# Patient Record
Sex: Male | Born: 1970
Health system: Southern US, Community
[De-identification: ages and names within clinical notes are randomized; demographics above are authoritative.]

## PROBLEM LIST (undated history)

## (undated) DIAGNOSIS — K219 Gastro-esophageal reflux disease without esophagitis: Secondary | ICD-10-CM

## (undated) DIAGNOSIS — F419 Anxiety disorder, unspecified: Secondary | ICD-10-CM

## (undated) DIAGNOSIS — G4730 Sleep apnea, unspecified: Secondary | ICD-10-CM

## (undated) HISTORY — PX: BACK SURGERY: SHX140

## (undated) HISTORY — PX: NASAL SEPTUM SURGERY: SHX37

---

## 2013-09-12 ENCOUNTER — Ambulatory Visit: Payer: BC Managed Care – PPO | Admitting: Family Medicine

## 2013-09-12 ENCOUNTER — Ambulatory Visit: Payer: BC Managed Care – PPO

## 2013-09-12 VITALS — BP 122/74 | HR 67 | Temp 97.9°F | Resp 16 | Ht 71.0 in | Wt 242.0 lb

## 2013-09-12 DIAGNOSIS — R059 Cough, unspecified: Secondary | ICD-10-CM

## 2013-09-12 DIAGNOSIS — J45909 Unspecified asthma, uncomplicated: Secondary | ICD-10-CM

## 2013-09-12 DIAGNOSIS — R05 Cough: Secondary | ICD-10-CM

## 2013-09-12 LAB — POCT CBC
Granulocyte percent: 61.3 %G (ref 37–80)
HEMATOCRIT: 52 % (ref 43.5–53.7)
HEMOGLOBIN: 17.3 g/dL (ref 14.1–18.1)
Lymph, poc: 2 (ref 0.6–3.4)
MCH, POC: 32.5 pg — AB (ref 27–31.2)
MCHC: 33.3 g/dL (ref 31.8–35.4)
MCV: 97.8 fL — AB (ref 80–97)
MID (cbc): 0.5 (ref 0–0.9)
MPV: 8.9 fL (ref 0–99.8)
POC GRANULOCYTE: 4 (ref 2–6.9)
POC LYMPH PERCENT: 30.6 %L (ref 10–50)
POC MID %: 8.1 %M (ref 0–12)
Platelet Count, POC: 268 10*3/uL (ref 142–424)
RBC: 5.32 M/uL (ref 4.69–6.13)
RDW, POC: 13 %
WBC: 6.6 10*3/uL (ref 4.6–10.2)

## 2013-09-12 MED ORDER — ALBUTEROL SULFATE (2.5 MG/3ML) 0.083% IN NEBU
2.5000 mg | INHALATION_SOLUTION | Freq: Once | RESPIRATORY_TRACT | Status: AC
  Administered 2013-09-12: 2.5 mg via RESPIRATORY_TRACT

## 2013-09-12 MED ORDER — ALBUTEROL SULFATE HFA 108 (90 BASE) MCG/ACT IN AERS
2.0000 | INHALATION_SPRAY | RESPIRATORY_TRACT | Status: DC | PRN
Start: 2013-09-12 — End: 2018-05-01

## 2013-09-12 MED ORDER — BECLOMETHASONE DIPROPIONATE 80 MCG/ACT IN AERS: 2.0000 | INHALATION_SPRAY | Freq: Two times a day (BID) | RESPIRATORY_TRACT | Status: DC

## 2013-09-12 MED ORDER — AZITHROMYCIN 250 MG PO TABS: ORAL_TABLET | ORAL | Status: DC

## 2013-09-12 NOTE — Patient Instructions (Addendum)
Qvar 2 inhalations twice daily  Albuterol inhaler 2 inhalations 4 times daily  zithromax 2 initially then one daily for infection  Drink plenty of water. The better hydrated UR the thenar any secretions will be.  If not improving dramatically over the next week get back to me

## 2013-09-12 NOTE — Progress Notes (Signed)
Subjective: Patient's family had a respiratory infection and persistent cough  about 6 weeks ago. He got in a couple weeks later. He is persisted with a cough. He denies wheezing wall with it he coughs when he lays down or when he gets up. He used to go to our church so I've known him in the past. Has not been running fever. He is tired of the cough. He just doesn't feel well  Objective: TMs normal. Throat clear. Neck supple without significant nodes. Chest had rhonchi and wheezing on the left side more than right. Heart regular without murmurs.  [facility] reading (PRIMARY) by  Dr. Linna Darner Normal cxr  Peak flow 450, 460 after treatment, then up to 480  Results for orders placed in visit on 09/12/13  POCT CBC      Result Value Ref Range   WBC 6.6  4.6 - 10.2 K/uL   Lymph, poc 2.0  0.6 - 3.4   POC LYMPH PERCENT 30.6  10 - 50 %L   MID (cbc) 0.5  0 - 0.9   POC MID % 8.1  0 - 12 %M   POC Granulocyte 4.0  2 - 6.9   Granulocyte percent 61.3  37 - 80 %G   RBC 5.32  4.69 - 6.13 M/uL   Hemoglobin 17.3  14.1 - 18.1 g/dL   HCT, POC 52.0  43.5 - 53.7 %   MCV 97.8 (*) 80 - 97 fL   MCH, POC 32.5 (*) 27 - 31.2 pg   MCHC 33.3  31.8 - 35.4 g/dL   RDW, POC 13.0     Platelet Count, POC 268  142 - 424 K/uL   MPV 8.9  0 - 99.8 fL   . Assessment: Asthmatic bronchitis  Plan: Albuterol Qvar Does not tolerate oral steroids apparently Z-Pak

## 2014-06-06 ENCOUNTER — Other Ambulatory Visit: Payer: Self-pay | Admitting: Internal Medicine

## 2014-06-06 DIAGNOSIS — N329 Bladder disorder, unspecified: Secondary | ICD-10-CM

## 2014-06-06 DIAGNOSIS — R1011 Right upper quadrant pain: Secondary | ICD-10-CM

## 2015-04-11 DIAGNOSIS — Z125 Encounter for screening for malignant neoplasm of prostate: Secondary | ICD-10-CM | POA: Diagnosis not present

## 2015-05-15 DIAGNOSIS — D2271 Melanocytic nevi of right lower limb, including hip: Secondary | ICD-10-CM | POA: Diagnosis not present

## 2015-05-15 DIAGNOSIS — Z7189 Other specified counseling: Secondary | ICD-10-CM | POA: Diagnosis not present

## 2015-05-15 DIAGNOSIS — D224 Melanocytic nevi of scalp and neck: Secondary | ICD-10-CM | POA: Diagnosis not present

## 2015-05-15 DIAGNOSIS — D2262 Melanocytic nevi of left upper limb, including shoulder: Secondary | ICD-10-CM | POA: Diagnosis not present

## 2015-05-15 DIAGNOSIS — D2239 Melanocytic nevi of other parts of face: Secondary | ICD-10-CM | POA: Diagnosis not present

## 2015-05-15 DIAGNOSIS — D2272 Melanocytic nevi of left lower limb, including hip: Secondary | ICD-10-CM | POA: Diagnosis not present

## 2015-05-15 DIAGNOSIS — D225 Melanocytic nevi of trunk: Secondary | ICD-10-CM | POA: Diagnosis not present

## 2015-05-15 DIAGNOSIS — D2261 Melanocytic nevi of right upper limb, including shoulder: Secondary | ICD-10-CM | POA: Diagnosis not present

## 2015-05-17 DIAGNOSIS — Z3009 Encounter for other general counseling and advice on contraception: Secondary | ICD-10-CM | POA: Diagnosis not present

## 2015-05-17 DIAGNOSIS — Z0000 Encounter for general adult medical examination without abnormal findings: Secondary | ICD-10-CM | POA: Diagnosis not present

## 2015-05-17 DIAGNOSIS — R351 Nocturia: Secondary | ICD-10-CM | POA: Diagnosis not present

## 2015-05-17 DIAGNOSIS — N401 Enlarged prostate with lower urinary tract symptoms: Secondary | ICD-10-CM | POA: Diagnosis not present

## 2015-06-01 MED FILL — HYDROCODON-APAP 5-325: 5-325 | 1 days supply | Qty: 8 | Fill #0

## 2015-06-01 MED FILL — diazePAM 10 MG TABS: 10 | 1 days supply | Qty: 1 | Fill #0

## 2015-06-07 MED FILL — ESOMEPRAZOLE MAG DR 40 MG C: 40 | 90 days supply | Qty: 90 | Fill #0

## 2015-06-07 MED FILL — ATORVASTATIN 20 MG TABLET: 20 | 90 days supply | Qty: 90 | Fill #0

## 2015-06-19 MED FILL — CEFPODOXIME 200 MG TABLET: 200 | 7 days supply | Qty: 14 | Fill #0

## 2015-09-13 MED FILL — ESOMEPRAZOLE MAG DR 40 MG C: 40 | 90 days supply | Qty: 90 | Fill #0

## 2015-09-13 MED FILL — ATORVASTATIN 20 MG TABLET: 20 | 90 days supply | Qty: 90 | Fill #1

## 2015-12-15 MED FILL — ESOMEPRAZOLE MAG DR 40 MG C: 40 | 90 days supply | Qty: 90 | Fill #0

## 2015-12-15 MED FILL — ATORVASTATIN 20 MG TABLET: 20 | 90 days supply | Qty: 90 | Fill #2

## 2015-12-27 DIAGNOSIS — J329 Chronic sinusitis, unspecified: Secondary | ICD-10-CM | POA: Diagnosis not present

## 2015-12-27 MED FILL — AZITHROMYCIN 250 MG TABLET: 250 | 5 days supply | Qty: 6 | Fill #0

## 2015-12-27 MED FILL — VENTOLIN HFA 90 MCG INHALER: 108 (90 BAS | 16 days supply | Qty: 18 | Fill #0

## 2016-02-14 MED FILL — AZITHROMYCIN 250 MG TABLET: 250 | 5 days supply | Qty: 6 | Fill #0

## 2016-02-20 DIAGNOSIS — Z23 Encounter for immunization: Secondary | ICD-10-CM | POA: Diagnosis not present

## 2016-03-13 MED FILL — ESOMEPRAZOLE MAG DR 40 MG C: 40 | 90 days supply | Qty: 90 | Fill #0

## 2016-03-13 MED FILL — ATORVASTATIN 20 MG TABLET: 20 | 90 days supply | Qty: 90 | Fill #3

## 2016-04-05 DIAGNOSIS — Z0000 Encounter for general adult medical examination without abnormal findings: Secondary | ICD-10-CM | POA: Diagnosis not present

## 2016-04-05 DIAGNOSIS — R748 Abnormal levels of other serum enzymes: Secondary | ICD-10-CM | POA: Diagnosis not present

## 2016-04-11 DIAGNOSIS — Z1212 Encounter for screening for malignant neoplasm of rectum: Secondary | ICD-10-CM | POA: Diagnosis not present

## 2016-04-11 DIAGNOSIS — J309 Allergic rhinitis, unspecified: Secondary | ICD-10-CM | POA: Diagnosis not present

## 2016-04-11 DIAGNOSIS — Z0001 Encounter for general adult medical examination with abnormal findings: Secondary | ICD-10-CM | POA: Diagnosis not present

## 2016-04-11 DIAGNOSIS — J4521 Mild intermittent asthma with (acute) exacerbation: Secondary | ICD-10-CM | POA: Diagnosis not present

## 2016-04-11 DIAGNOSIS — R350 Frequency of micturition: Secondary | ICD-10-CM | POA: Diagnosis not present

## 2016-04-27 ENCOUNTER — Encounter: Payer: Self-pay | Admitting: Pulmonary Disease

## 2016-05-06 DIAGNOSIS — K580 Irritable bowel syndrome with diarrhea: Secondary | ICD-10-CM | POA: Diagnosis not present

## 2016-05-06 DIAGNOSIS — K210 Gastro-esophageal reflux disease with esophagitis: Secondary | ICD-10-CM | POA: Diagnosis not present

## 2016-05-06 DIAGNOSIS — R748 Abnormal levels of other serum enzymes: Secondary | ICD-10-CM | POA: Diagnosis not present

## 2016-06-10 MED FILL — ESOMEPRAZOLE MAG DR 40 MG C: 40 | 90 days supply | Qty: 90 | Fill #0

## 2016-06-10 MED FILL — ATORVASTATIN 20 MG TABLET: 20 | 90 days supply | Qty: 90 | Fill #0

## 2016-07-15 MED FILL — PREVIDENT 5000 SENSITIVE PA: 1.1-5 | 30 days supply | Qty: 100 | Fill #0

## 2016-08-05 DIAGNOSIS — L821 Other seborrheic keratosis: Secondary | ICD-10-CM | POA: Diagnosis not present

## 2016-08-05 DIAGNOSIS — D235 Other benign neoplasm of skin of trunk: Secondary | ICD-10-CM | POA: Diagnosis not present

## 2016-08-12 DIAGNOSIS — J069 Acute upper respiratory infection, unspecified: Secondary | ICD-10-CM | POA: Diagnosis not present

## 2016-08-12 DIAGNOSIS — J040 Acute laryngitis: Secondary | ICD-10-CM | POA: Diagnosis not present

## 2016-08-12 MED FILL — predniSONE 50 MG TABS: 50 | 4 days supply | Qty: 4 | Fill #0

## 2016-09-09 DIAGNOSIS — R05 Cough: Secondary | ICD-10-CM | POA: Diagnosis not present

## 2016-09-09 DIAGNOSIS — J0100 Acute maxillary sinusitis, unspecified: Secondary | ICD-10-CM | POA: Diagnosis not present

## 2016-09-09 DIAGNOSIS — Z7721 Contact with and (suspected) exposure to potentially hazardous body fluids: Secondary | ICD-10-CM | POA: Diagnosis not present

## 2016-09-09 MED FILL — ESOMEPRAZOLE MAG DR 40 MG C: 40 | 90 days supply | Qty: 90 | Fill #0

## 2016-09-18 MED FILL — ATORVASTATIN 20 MG TABLET: 20 | 90 days supply | Qty: 90 | Fill #1

## 2016-09-26 MED FILL — raNITIdine HCL 150 MG TABS: 150 | 30 days supply | Qty: 60 | Fill #0

## 2016-11-05 MED FILL — raNITIdine HCL 150 MG TABS: 150 | 30 days supply | Qty: 60 | Fill #1

## 2016-12-10 MED FILL — ESOMEPRAZOLE MAG DR 40 MG C: 40 | 90 days supply | Qty: 90 | Fill #0

## 2016-12-10 MED FILL — PREVIDENT 5000 SENSITIVE PA: 1.1-5 | 30 days supply | Qty: 100 | Fill #1

## 2016-12-31 MED FILL — ATORVASTATIN 20 MG TABLET: 20 | 30 days supply | Qty: 30 | Fill #2

## 2017-01-28 MED FILL — ATORVASTATIN 20 MG TABLET: 20 | 30 days supply | Qty: 30 | Fill #3

## 2017-01-28 MED FILL — PREVIDENT 5000 SENSITIVE PA: 1.1-5 | 30 days supply | Qty: 100 | Fill #0

## 2017-02-25 MED FILL — ATORVASTATIN 20 MG TABLET: 20 | 30 days supply | Qty: 30 | Fill #4

## 2017-04-23 MED FILL — PREVIDENT 5000 SENSITIVE PA: 1.1-5 | 30 days supply | Qty: 100 | Fill #1

## 2017-04-24 MED FILL — SERTRALINE HCL 100 MG TAB: 100 | 30 days supply | Qty: 30 | Fill #0

## 2017-05-05 MED FILL — AZITHROMYCIN 250 MG TAB: 250 | 5 days supply | Qty: 6 | Fill #0

## 2017-05-07 DIAGNOSIS — R1111 Vomiting without nausea: Secondary | ICD-10-CM | POA: Diagnosis not present

## 2017-05-28 MED FILL — SERTRALINE HCL 100 MG TAB: 100 | 30 days supply | Qty: 30 | Fill #1

## 2017-06-20 MED FILL — VENTOLIN HFA 90 MCG INHALER: 108 (90 BAS | 16 days supply | Qty: 18 | Fill #0

## 2017-06-27 MED FILL — SERTRALINE HCL 100 MG TAB: 100 | 30 days supply | Qty: 30 | Fill #2

## 2017-07-28 MED FILL — SERTRALINE HCL 100 MG TAB: 100 | 30 days supply | Qty: 30 | Fill #3

## 2017-07-31 MED FILL — DOXYCYCLINE HYCLATE 100 MG: 100 | 10 days supply | Qty: 20 | Fill #0

## 2017-07-31 MED FILL — GUAIATUSSIN AC LIQUID: 100-10 | 6 days supply | Qty: 120 | Fill #0

## 2017-08-28 MED FILL — SERTRALINE HCL 100 MG TAB: 100 | 30 days supply | Qty: 30 | Fill #4

## 2017-08-28 MED FILL — PREVIDENT 5000 SENSITIVE PA: 1.1-5 | 30 days supply | Qty: 100 | Fill #0

## 2017-09-25 MED FILL — SERTRALINE HCL 100 MG TAB: 100 | 30 days supply | Qty: 30 | Fill #5

## 2017-09-25 MED FILL — ATORVASTATIN CALCIUM 20 MG: 20 | 30 days supply | Qty: 30 | Fill #0

## 2017-10-27 MED FILL — ATORVASTATIN CALCIUM 20 MG: 20 | 30 days supply | Qty: 30 | Fill #1

## 2017-10-27 MED FILL — SERTRALINE HCL 100 MG TAB: 100 | 30 days supply | Qty: 30 | Fill #6

## 2017-11-26 MED FILL — ATORVASTATIN CALCIUM 20 MG: 20 | 30 days supply | Qty: 30 | Fill #2

## 2017-11-26 MED FILL — SERTRALINE HCL 100 MG TAB: 100 | 30 days supply | Qty: 30 | Fill #7

## 2017-12-28 MED FILL — ATORVASTATIN CALCIUM 20 MG: 20 | 30 days supply | Qty: 30 | Fill #3

## 2017-12-28 MED FILL — SERTRALINE HCL 100 MG TAB: 100 | 30 days supply | Qty: 30 | Fill #8

## 2018-01-02 DIAGNOSIS — E7800 Pure hypercholesterolemia, unspecified: Secondary | ICD-10-CM | POA: Diagnosis not present

## 2018-01-06 DIAGNOSIS — R7303 Prediabetes: Secondary | ICD-10-CM | POA: Diagnosis not present

## 2018-01-06 DIAGNOSIS — Z6836 Body mass index (BMI) 36.0-36.9, adult: Secondary | ICD-10-CM | POA: Diagnosis not present

## 2018-01-07 DIAGNOSIS — K582 Mixed irritable bowel syndrome: Secondary | ICD-10-CM | POA: Diagnosis not present

## 2018-01-07 DIAGNOSIS — E6609 Other obesity due to excess calories: Secondary | ICD-10-CM | POA: Diagnosis not present

## 2018-01-14 DIAGNOSIS — E6609 Other obesity due to excess calories: Secondary | ICD-10-CM | POA: Diagnosis not present

## 2018-01-14 DIAGNOSIS — K582 Mixed irritable bowel syndrome: Secondary | ICD-10-CM | POA: Diagnosis not present

## 2018-01-27 MED FILL — ATORVASTATIN CALCIUM 20 MG: 20 | 30 days supply | Qty: 30 | Fill #4

## 2018-01-27 MED FILL — SERTRALINE HCL 100 MG TAB: 100 | 30 days supply | Qty: 30 | Fill #9

## 2018-02-12 DIAGNOSIS — J0100 Acute maxillary sinusitis, unspecified: Secondary | ICD-10-CM | POA: Diagnosis not present

## 2018-02-12 DIAGNOSIS — J309 Allergic rhinitis, unspecified: Secondary | ICD-10-CM | POA: Diagnosis not present

## 2018-02-12 DIAGNOSIS — J111 Influenza due to unidentified influenza virus with other respiratory manifestations: Secondary | ICD-10-CM | POA: Diagnosis not present

## 2018-02-12 MED FILL — OSELTAMIVIR PHOSPHATE 75 MG: 75 | 5 days supply | Qty: 10 | Fill #0

## 2018-02-12 MED FILL — AZITHROMYCIN 250 MG TABLET: 250 | 6 days supply | Qty: 6 | Fill #0

## 2018-03-02 MED FILL — SERTRALINE HCL 100 MG TAB: 100 | 30 days supply | Qty: 30 | Fill #10

## 2018-03-02 MED FILL — ATORVASTATIN CALCIUM 20 MG: 20 | 30 days supply | Qty: 30 | Fill #5

## 2018-03-30 MED FILL — SERTRALINE HCL 100 MG TAB: 100 | 30 days supply | Qty: 30 | Fill #11

## 2018-03-30 MED FILL — ATORVASTATIN CALCIUM 20 MG: 20 | 30 days supply | Qty: 30 | Fill #6

## 2018-04-09 DIAGNOSIS — H52203 Unspecified astigmatism, bilateral: Secondary | ICD-10-CM | POA: Diagnosis not present

## 2018-04-09 DIAGNOSIS — H524 Presbyopia: Secondary | ICD-10-CM | POA: Diagnosis not present

## 2018-04-09 DIAGNOSIS — H353111 Nonexudative age-related macular degeneration, right eye, early dry stage: Secondary | ICD-10-CM | POA: Diagnosis not present

## 2018-04-14 MED FILL — AZITHROMYCIN 250 MG TABLET: 250 | 5 days supply | Qty: 6 | Fill #0

## 2018-04-23 DIAGNOSIS — Z0000 Encounter for general adult medical examination without abnormal findings: Secondary | ICD-10-CM | POA: Diagnosis not present

## 2018-04-28 DIAGNOSIS — R69 Illness, unspecified: Secondary | ICD-10-CM | POA: Diagnosis not present

## 2018-04-28 DIAGNOSIS — G4733 Obstructive sleep apnea (adult) (pediatric): Secondary | ICD-10-CM | POA: Diagnosis not present

## 2018-04-28 DIAGNOSIS — Z0001 Encounter for general adult medical examination with abnormal findings: Secondary | ICD-10-CM | POA: Diagnosis not present

## 2018-04-28 DIAGNOSIS — J309 Allergic rhinitis, unspecified: Secondary | ICD-10-CM | POA: Diagnosis not present

## 2018-04-28 DIAGNOSIS — B351 Tinea unguium: Secondary | ICD-10-CM | POA: Diagnosis not present

## 2018-04-28 DIAGNOSIS — K5900 Constipation, unspecified: Secondary | ICD-10-CM | POA: Diagnosis not present

## 2018-04-28 DIAGNOSIS — K219 Gastro-esophageal reflux disease without esophagitis: Secondary | ICD-10-CM | POA: Diagnosis not present

## 2018-04-28 DIAGNOSIS — Z1212 Encounter for screening for malignant neoplasm of rectum: Secondary | ICD-10-CM | POA: Diagnosis not present

## 2018-04-28 DIAGNOSIS — E7800 Pure hypercholesterolemia, unspecified: Secondary | ICD-10-CM | POA: Diagnosis not present

## 2018-04-28 MED FILL — buPROPion HCL ER (XL) 150 M: 150 | 30 days supply | Qty: 50 | Fill #0

## 2018-05-01 ENCOUNTER — Encounter: Payer: Self-pay | Admitting: Pulmonary Disease

## 2018-05-01 ENCOUNTER — Ambulatory Visit (INDEPENDENT_AMBULATORY_CARE_PROVIDER_SITE_OTHER): Payer: 59 | Admitting: Pulmonary Disease

## 2018-05-01 VITALS — BP 118/80 | HR 79 | Ht 71.0 in | Wt 276.2 lb

## 2018-05-01 DIAGNOSIS — E669 Obesity, unspecified: Secondary | ICD-10-CM | POA: Diagnosis not present

## 2018-05-01 DIAGNOSIS — G4733 Obstructive sleep apnea (adult) (pediatric): Secondary | ICD-10-CM

## 2018-05-01 NOTE — Assessment & Plan Note (Signed)
Weight loss encouraged. Hopefully, we can break the cycle of fatigue and weight gain while using a CPAP

## 2018-05-01 NOTE — Assessment & Plan Note (Addendum)
Given excessive daytime somnolence, narrow pharyngeal exam, witnessed apneas & loud snoring, obstructive sleep apnea is very likely & an overnight polysomnogram will be scheduled as a home study. The pathophysiology of obstructive sleep apnea , it's cardiovascular consequences & modes of treatment including CPAP were discused with the patient in detail & they evidenced understanding.  Pretest probability is high.  He is agreeable to using a CPAP machine.  He is likely a mouth breather and would need a full facemask  Addendum-reviewed records from PCP, had apnea link study 04/27/2016 which showed AHI of 79/hour with lowest desaturation of 75%

## 2018-05-01 NOTE — Patient Instructions (Signed)
  Home sleep test will be scheduled. Based on this we will get you started on a CPAP machine

## 2018-05-01 NOTE — Progress Notes (Signed)
Subjective:    Patient ID: Juan Gallegos, male    DOB: 09-08-1970, 48 y.o.   MRN: [id number]  HPI  Chief Complaint  Patient presents with  . Sleep Consult    Referred by Dr. Shelia Media for possible OSA.     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 He reports increased fatigue and drowsiness during the day.  His wife has noted loud snoring which is increased over the past year and witnessed apneas. He reports increased daytime sleepiness and sleep pressure when he is not on his feet.  Epworth sleepiness score is 9 and he reports sleepiness as a passenger in a car or lying down to rest in the afternoons.  He will take naps on weekends for about 2 hours lying down in his bed and these are refreshing. Bedtime is between 10:11 PM, sleep latency is minimal, he sleeps on his left side with one pillow, reports 1-2 nocturnal awakenings including nocturia and is out of bed by 7 AM feeling tired with dryness of mouth and occasionally has headaches that last until afternoon. He is slow to get going and sometimes it is around 11:00 before he gets energetic. He has gained about 10 pounds in the last year.  There is no history suggestive of cataplexy, sleep paralysis or parasomnias  Past medical history of hyperlipidemia and depression and GERD  Past surgical history deviated nasal septum as a teenager  Allergies  Allergen Reactions  . Prednisone     Hot flashes    Social History   Socioeconomic History  . Marital status: Married    Spouse name: Not on file  . Number of children: Not on file  . Years of education: Not on file  . Highest education level: Not on file  Occupational History  . Not on file  Social Needs  . Financial resource strain: Not on file  . Food insecurity:    Worry: Not on file    Inability: Not on file  . Transportation needs:    Medical: Not on file    Non-medical: Not on file  Tobacco Use  .  Smoking status: Never Smoker  . Smokeless tobacco: Never Used  Substance and Sexual Activity  . Alcohol use: No  . Drug use: No  . Sexual activity: Not on file  Lifestyle  . Physical activity:    Days per week: Not on file    Minutes per session: Not on file  . Stress: Not on file  Relationships  . Social connections:    Talks on phone: Not on file    Gets together: Not on file    Attends religious service: Not on file    Active member of club or organization: Not on file    Attends meetings of clubs or organizations: Not on file    Relationship status: Not on file  . Intimate partner violence:    Fear of current or ex partner: Not on file    Emotionally abused: Not on file    Physically abused: Not on file    Forced sexual activity: Not on file  Other Topics Concern  . Not on file  Social History Narrative  . Not on file     Family history-sister has OSA but did not like CPAP    Review of Systems  Constitutional: negative for anorexia, fevers and sweats  Eyes: negative for irritation, redness and visual disturbance  Ears, nose, mouth, throat, and  face: negative for earaches, epistaxis, nasal congestion and sore throat  Respiratory: negative for cough, dyspnea on exertion, sputum and wheezing  Cardiovascular: negative for chest pain, dyspnea, lower extremity edema, orthopnea, palpitations and syncope  Gastrointestinal: negative for abdominal pain, constipation, diarrhea, melena, nausea and vomiting  Genitourinary:negative for dysuria, frequency and hematuria  Hematologic/lymphatic: negative for bleeding, easy bruising and lymphadenopathy  Musculoskeletal:negative for arthralgias, muscle weakness and stiff joints  Neurological: negative for coordination problems, gait problems, headaches and weakness  Endocrine: negative for diabetic symptoms including polydipsia, polyuria and weight loss     Objective:   Physical Exam  Gen. Pleasant, obese, in no distress, normal  affect ENT - no pallor,icterus, no post nasal drip, class 2-3 airway, mild overbite, long uvula Neck: No JVD, no thyromegaly, no carotid bruits Lungs: no use of accessory muscles, no dullness to percussion, decreased without rales or rhonchi  Cardiovascular: Rhythm regular, heart sounds  normal, no murmurs or gallops, no peripheral edema Abdomen: soft and non-tender, no hepatosplenomegaly, BS normal. Musculoskeletal: No deformities, no cyanosis or clubbing Neuro:  alert, non focal, no tremors       Assessment & Plan:

## 2018-05-05 MED FILL — ATORVASTATIN CALCIUM 20 MG: 20 | 30 days supply | Qty: 30 | Fill #7

## 2018-05-05 MED FILL — SERTRALINE HCL 100 MG TAB: 100 | 30 days supply | Qty: 25 | Fill #0

## 2018-05-18 DIAGNOSIS — B351 Tinea unguium: Secondary | ICD-10-CM | POA: Diagnosis not present

## 2018-05-18 MED FILL — TERBINAFINE HCL 250 MG TAB: 250 | 30 days supply | Qty: 30 | Fill #0

## 2018-05-22 DIAGNOSIS — G4733 Obstructive sleep apnea (adult) (pediatric): Secondary | ICD-10-CM

## 2018-05-23 DIAGNOSIS — G4733 Obstructive sleep apnea (adult) (pediatric): Secondary | ICD-10-CM | POA: Diagnosis not present

## 2018-05-27 ENCOUNTER — Telehealth: Payer: Self-pay | Admitting: Pulmonary Disease

## 2018-05-27 DIAGNOSIS — R69 Illness, unspecified: Secondary | ICD-10-CM | POA: Diagnosis not present

## 2018-05-27 DIAGNOSIS — B351 Tinea unguium: Secondary | ICD-10-CM | POA: Diagnosis not present

## 2018-05-27 DIAGNOSIS — G4733 Obstructive sleep apnea (adult) (pediatric): Secondary | ICD-10-CM | POA: Diagnosis not present

## 2018-05-27 NOTE — Telephone Encounter (Signed)
Okay to provide written RX

## 2018-05-27 NOTE — Telephone Encounter (Signed)
Per RA, HST showed severe OSA with 73 events per hour. Recommends auto cpap 5-20cm, mask of choice.   Spoke with patient. He is willing to proceed with the CPAP machine but does not want to file it under insurance. He is requesting a written RX. Once the RX has been produced, he wants to come by the office to pick it up.   RA, please advise if you are ok with him having a written RX for his CPAP machine. Thanks!

## 2018-05-27 NOTE — Telephone Encounter (Signed)
CPAP prescription printed and signed by Dr Elsworth Soho.  Called and spoke with Patient.  He stated that he would come by this afternoon to pick it up.  Placed prescription in sealed envelope at front desk. Nothing further at this time.

## 2018-06-01 MED FILL — ATORVASTATIN 20 MG TABLET: 20 | 30 days supply | Qty: 30 | Fill #8

## 2018-06-01 MED FILL — SERTRALINE HCL 100 MG TAB: 100 | 30 days supply | Qty: 25 | Fill #1

## 2018-06-02 DIAGNOSIS — K219 Gastro-esophageal reflux disease without esophagitis: Secondary | ICD-10-CM | POA: Diagnosis not present

## 2018-06-02 DIAGNOSIS — R194 Change in bowel habit: Secondary | ICD-10-CM | POA: Diagnosis not present

## 2018-06-02 DIAGNOSIS — Z1211 Encounter for screening for malignant neoplasm of colon: Secondary | ICD-10-CM | POA: Diagnosis not present

## 2018-06-02 MED FILL — PEG-3350 SOLUTION: 420 | 1 days supply | Qty: 4000 | Fill #0

## 2018-06-03 ENCOUNTER — Other Ambulatory Visit: Payer: Self-pay | Admitting: Gastroenterology

## 2018-06-03 NOTE — Anesthesia Preprocedure Evaluation (Addendum)
Anesthesia Evaluation  Patient identified by MRN, date of birth, ID band Patient awake    Reviewed: Allergy & Precautions, NPO status , Patient's Chart, lab work & pertinent test results  Airway Mallampati: III  TM Distance: >3 FB Neck ROM: Full    Dental  (+) Dental Advisory Given   Pulmonary sleep apnea and Continuous Positive Airway Pressure Ventilation ,    breath sounds clear to auscultation       Cardiovascular negative cardio ROS   Rhythm:Regular Rate:Normal     Neuro/Psych negative neurological ROS     GI/Hepatic Neg liver ROS, GERD  ,  Endo/Other  negative endocrine ROS  Renal/GU negative Renal ROS     Musculoskeletal   Abdominal   Peds  Hematology negative hematology ROS (+)   Anesthesia Other Findings   Reproductive/Obstetrics                            Anesthesia Physical Anesthesia Plan  ASA: II  Anesthesia Plan: MAC   Post-op Pain Management:    Induction: Intravenous  PONV Risk Score and Plan: 1 and Propofol infusion, Ondansetron and Treatment may vary due to age or medical condition  Airway Management Planned: Natural Airway and Simple Face Mask  Additional Equipment:   Intra-op Plan:   Post-operative Plan:   Informed Consent: I have reviewed the patients History and Physical, chart, labs and discussed the procedure including the risks, benefits and alternatives for the proposed anesthesia with the patient or authorized representative who has indicated his/her understanding and acceptance.       Plan Discussed with: CRNA  Anesthesia Plan Comments:        Anesthesia Quick Evaluation

## 2018-06-04 ENCOUNTER — Ambulatory Visit (HOSPITAL_COMMUNITY): Payer: 59 | Admitting: Certified Registered"

## 2018-06-04 ENCOUNTER — Encounter (HOSPITAL_COMMUNITY)
Admission: RE | Disposition: A | Discharge status: Home / Self Care | Payer: Self-pay | Source: Home / Self Care | Attending: Gastroenterology

## 2018-06-04 ENCOUNTER — Ambulatory Visit (HOSPITAL_COMMUNITY)
Admission: RE | Admit: 2018-06-04 | Discharge: 2018-06-04 | Disposition: A | Discharge status: Home / Self Care | Payer: 59 | Attending: Gastroenterology | Admitting: Gastroenterology

## 2018-06-04 ENCOUNTER — Other Ambulatory Visit: Payer: Self-pay

## 2018-06-04 ENCOUNTER — Encounter (HOSPITAL_COMMUNITY): Payer: Self-pay | Admitting: *Deleted

## 2018-06-04 DIAGNOSIS — Z6838 Body mass index (BMI) 38.0-38.9, adult: Secondary | ICD-10-CM | POA: Diagnosis not present

## 2018-06-04 DIAGNOSIS — F419 Anxiety disorder, unspecified: Secondary | ICD-10-CM | POA: Diagnosis not present

## 2018-06-04 DIAGNOSIS — D122 Benign neoplasm of ascending colon: Secondary | ICD-10-CM | POA: Insufficient documentation

## 2018-06-04 DIAGNOSIS — K589 Irritable bowel syndrome without diarrhea: Secondary | ICD-10-CM | POA: Diagnosis not present

## 2018-06-04 DIAGNOSIS — Z888 Allergy status to other drugs, medicaments and biological substances status: Secondary | ICD-10-CM | POA: Diagnosis not present

## 2018-06-04 DIAGNOSIS — Z79899 Other long term (current) drug therapy: Secondary | ICD-10-CM | POA: Insufficient documentation

## 2018-06-04 DIAGNOSIS — D125 Benign neoplasm of sigmoid colon: Secondary | ICD-10-CM | POA: Diagnosis not present

## 2018-06-04 DIAGNOSIS — D123 Benign neoplasm of transverse colon: Secondary | ICD-10-CM | POA: Insufficient documentation

## 2018-06-04 DIAGNOSIS — F329 Major depressive disorder, single episode, unspecified: Secondary | ICD-10-CM | POA: Diagnosis not present

## 2018-06-04 DIAGNOSIS — E785 Hyperlipidemia, unspecified: Secondary | ICD-10-CM | POA: Insufficient documentation

## 2018-06-04 DIAGNOSIS — Z1211 Encounter for screening for malignant neoplasm of colon: Secondary | ICD-10-CM | POA: Diagnosis not present

## 2018-06-04 DIAGNOSIS — G4730 Sleep apnea, unspecified: Secondary | ICD-10-CM | POA: Diagnosis not present

## 2018-06-04 DIAGNOSIS — R194 Change in bowel habit: Secondary | ICD-10-CM | POA: Diagnosis present

## 2018-06-04 DIAGNOSIS — D120 Benign neoplasm of cecum: Secondary | ICD-10-CM | POA: Diagnosis not present

## 2018-06-04 DIAGNOSIS — K635 Polyp of colon: Secondary | ICD-10-CM | POA: Insufficient documentation

## 2018-06-04 DIAGNOSIS — Z791 Long term (current) use of non-steroidal anti-inflammatories (NSAID): Secondary | ICD-10-CM | POA: Insufficient documentation

## 2018-06-04 HISTORY — PX: COLONOSCOPY WITH PROPOFOL: SHX5780

## 2018-06-04 HISTORY — PX: POLYPECTOMY: SHX5525

## 2018-06-04 HISTORY — PX: BIOPSY: SHX5522

## 2018-06-04 SURGERY — COLONOSCOPY WITH PROPOFOL
Anesthesia: Monitor Anesthesia Care

## 2018-06-04 MED ORDER — PROPOFOL 10 MG/ML IV BOLUS
INTRAVENOUS | Status: AC
  Filled 2018-06-04: qty 60

## 2018-06-04 MED ORDER — PROPOFOL 10 MG/ML IV BOLUS
INTRAVENOUS | Status: DC | PRN
  Administered 2018-06-04 (×3): 20 mg via INTRAVENOUS

## 2018-06-04 MED ORDER — PROPOFOL 500 MG/50ML IV EMUL
INTRAVENOUS | Status: DC | PRN
  Administered 2018-06-04: 125 ug/kg/min via INTRAVENOUS

## 2018-06-04 MED ORDER — LACTATED RINGERS IV SOLN
INTRAVENOUS | Status: DC
  Administered 2018-06-04: 07:00:00 via INTRAVENOUS

## 2018-06-04 SURGICAL SUPPLY — 21 items

## 2018-06-04 NOTE — Discharge Instructions (Signed)

## 2018-06-04 NOTE — Transfer of Care (Signed)
Immediate Anesthesia Transfer of Care Note  Patient: Juan Gallegos  Procedure(s) Performed: COLONOSCOPY WITH PROPOFOL (N/A ) POLYPECTOMY BIOPSY  Patient Location: PACU and Endoscopy Unit  Anesthesia Type:MAC  Level of Consciousness: awake, alert  and oriented  Airway & Oxygen Therapy: Patient Spontanous Breathing and Patient connected to face mask oxygen  Post-op Assessment: Report given to RN and Post -op Vital signs reviewed and stable  Post vital signs: Reviewed and stable  Last Vitals:  Vitals Value Taken Time  BP    Temp    Pulse    Resp    SpO2      Last Pain:  Vitals:   06/04/18 0630  TempSrc: Oral  PainSc: 0-No pain         Complications: No apparent anesthesia complications

## 2018-06-04 NOTE — H&P (Signed)
Juan Gallegos is an 48 y.o. male.   Chief Complaint: Colorectal cancer screening. HPI: 48 year old white male here for a colonoscopy; he has had a change in bowel habits. See office notes for details.  PMH-Hyperlipdemia, Depression, Anxiety disorder, Severe sleep apnea, IBS, Morbid obesity.  History reviewed. Septoplasty for DNS; Large hemangiona removed from the back..  History reviewed. No pertinent family history. Social History:  reports that he has never smoked. He has never used smokeless tobacco. He reports that he does not drink alcohol or use drugs.  Allergies:  Allergies  Allergen Reactions  . Prednisone     Hot flashes   Medications Prior to Admission  Medication Sig Dispense Refill  . atorvastatin (LIPITOR) 20 MG tablet Take 20 mg by mouth daily.     Marland Kitchen buPROPion (WELLBUTRIN XL) 150 MG 24 hr tablet Take 150 mg by mouth daily.    . cetirizine (ZYRTEC) 10 MG tablet Take 10 mg by mouth daily as needed for allergies.     . Cholecalciferol (VITAMIN D) 50 MCG (2000 UT) tablet Take 2,000 Units by mouth daily.    . Coenzyme Q10 (COQ-10) 200 MG CAPS Take 200 mg by mouth daily.    Marland Kitchen esomeprazole (NEXIUM) 20 MG capsule Take 20 mg by mouth 2 (two) times daily.    . fluticasone (FLONASE) 50 MCG/ACT nasal spray Place 2 sprays into both nostrils daily as needed for allergies.     Marland Kitchen ibuprofen (ADVIL,MOTRIN) 200 MG tablet Take 400 mg by mouth every 6 (six) hours as needed for moderate pain.    . Multiple Vitamin (MULTIVITAMIN WITH MINERALS) TABS tablet Take 1 tablet by mouth daily.    . Omega-3 Fatty Acids (FISH OIL PO) Take 1 capsule by mouth daily.    . sertraline (ZOLOFT) 100 MG tablet Take 100 mg by mouth every evening.     . terbinafine (LAMISIL) 250 MG tablet Take 250 mg by mouth daily.      No results found for this or any previous visit (from the past 48 hour(s)). No results found.  Review of Systems  Constitutional: Negative.   HENT: Negative.   Eyes: Negative.   Respiratory:  Negative.   Cardiovascular: Negative.   Gastrointestinal: Positive for constipation. Negative for abdominal pain, diarrhea, heartburn, melena, nausea and vomiting.  Genitourinary: Negative.   Skin: Negative.   Neurological: Negative.   Endo/Heme/Allergies: Negative.   Psychiatric/Behavioral: Positive for depression. Negative for hallucinations, memory loss, substance abuse and suicidal ideas. The patient is nervous/anxious. The patient does not have insomnia.    Blood pressure 131/78, pulse 83, temperature 98 F (36.7 C), temperature source Oral, resp. rate 12, height 5\' 11"  (1.803 m), weight 126.1 kg, SpO2 99 %. Physical Exam  Constitutional: He is oriented to person, place, and time. He appears well-developed and well-nourished.  Morbid obesity  HENT:  Head: Normocephalic and atraumatic.  Eyes: Pupils are equal, round, and reactive to light. Conjunctivae and EOM are normal.  Neck: Normal range of motion. Neck supple.  Cardiovascular: Normal rate and regular rhythm.  Respiratory: Effort normal and breath sounds normal.  GI: Soft. Bowel sounds are normal.  Musculoskeletal: Normal range of motion.  Neurological: He is alert and oriented to person, place, and time.  Skin: Skin is warm and dry.  Psychiatric: He has a normal mood and affect. His behavior is normal. Judgment and thought content normal.    Assessment/Plan Colorectal cancer screening/Change in bowel habits-proceed with a colonoscopy at this time.  Suraiya Dickerson,  MD 06/04/2018, 7:15 AM

## 2018-06-04 NOTE — Op Note (Signed)
[facility] Patient Name: Juan Gallegos Procedure Date: 06/04/2018 MRN: [id number] Attending MD: Juanita Craver , MD Date of Birth: 07/19/1970 CSN: [id number] Age: 48 Admit Type: Outpatient Procedure:                Colonoscopy with cold biopsies & hot snare                            polypectomy x 3. Indications:              Colorectal cancer screening for colorectal                            malignant neoplasm. Providers:                Juanita Craver, MD, Vista Lawman, RN, William Dalton,                            Technician, Angus Seller, Jefm Miles, CRNA. Referring MD:             Deland Pretty, MD. Medicines:                Monitored Anesthesia Care Complications:            No immediate complications. Estimated Blood Loss:     Estimated blood loss was minimal. Procedure:                Pre-Anesthesia Assessment: - Prior to the                            procedure, a history and physical was performed,                            and patient medications and allergies were                            reviewed. The patient's tolerance of previous                            anesthesia was also reviewed. The risks and                            benefits of the procedure and the sedation options                            and risks were discussed with the patient. All                            questions were answered, and informed consent was                            obtained. Prior Anticoagulants: The patient has                            taken no previous anticoagulant or antiplatelet  agents. ASA Grade Assessment: III - A patient with                            severe systemic disease. After reviewing the risks                            and benefits, the patient was deemed in                            satisfactory condition to undergo the procedure.                            After obtaining informed consent, the colonoscope                   was passed under direct vision. Throughout the                            procedure, the patient's blood pressure, pulse, and                            oxygen saturations were monitored continuously. The                            CF-HQ190L ([id number]) Olympus colonoscope was                            introduced through the anus and advanced to the the                            terminal ileum, with identification of the                            appendiceal orifice and IC valve. The colonoscopy                            was performed without difficulty. The patient                            tolerated the procedure well. The quality of the                            bowel preparation was good. The terminal ileum, the                            ileocecal valve, the appendiceal orifice and the                            rectum were photographed. The bowel preparation                            used was GoLYTELY. Scope In: 7:32:59 AM Scope Out: 7:55:34 AM Scope Withdrawal Time: 0 hours 15 minutes 57 seconds  Total Procedure Duration: 0 hours 22 minutes 35 seconds  Findings:  Three 6-7 mm sessile polyps were found, 2 in the proximal sigmoid colon       and 1 in the distal transverse colon; these polyps were removed with a       hot snare x 3-200/20; resection and retrieval were complete.      Two small sessile polyps were found in the proximal ascending colon and       cecum respectively; these were removed by cold biopsies.      The terminal ileum appeared normal.      The exam was otherwise without abnormality on direct and retroflexion       views. Impression:               - Three 6-7 mm sessile polyps, 2 in the proximal                            sigmoid colon and 1 in the distal transverse colon,                            removed with a hot snare x 3; resected and                            retrieved.                           - Two small sessile polyps, 1 in  the proximal                            ascending colon and 1 in the cecum-removed by cold                            biopsies.                           - The examined portion of the ileum was normal.                           - The examination was otherwise normal on direct                            and retroflexion views. Moderate Sedation:      MAC used. Recommendation:           - High fiber diet with augmented water consumption                            daily.                           - Continue present medications.                           - Await pathology results.                           - Repeat colonoscopy in 3 - 5 years for  surveillance.                           - Return to GI office PRN.                           - No Ibuprofen, Naproxen, or other non-steroidal                            anti-inflammatory drugs for 2 weeks after polyp                            removal.                           - If the patient has any abnormal GI symptoms in                            the interim, he has been advised to call the office                            ASAP for further recommendations. Procedure Code(s):        --- Professional ---                           [id number], Colonoscopy, flexible; with removal of                            tumor(s), polyp(s), or other lesion(s) by snare                            technique                           45380, 41, Colonoscopy, flexible; with biopsy,                            single or multiple Diagnosis Code(s):        --- Professional ---                           Z12.11, Encounter for screening for malignant                            neoplasm of colon                           D12.0, Benign neoplasm of cecum                           D12.2, Benign neoplasm of ascending colon                           D12.5, Benign neoplasm of sigmoid colon                           D12.3, Benign neoplasm of transverse  colon (hepatic                            flexure or splenic flexure) CPT copyright 2018 American Medical Association. All rights reserved. The codes documented in this report are preliminary and upon coder review may  be revised to meet current compliance requirements. Juanita Craver, MD Juanita Craver, MD 06/04/2018 8:09:12 AM This report has been signed electronically. Number of Addenda: 0

## 2018-06-04 NOTE — Anesthesia Procedure Notes (Signed)
Procedure Name: MAC Date/Time: 06/04/2018 7:28 AM Performed by: Eben Burow, CRNA Pre-anesthesia Checklist: Patient identified, Emergency Drugs available, Suction available, Patient being monitored and Timeout performed Oxygen Delivery Method: Simple face mask Dental Injury: Teeth and Oropharynx as per pre-operative assessment

## 2018-06-04 NOTE — Anesthesia Postprocedure Evaluation (Signed)
Anesthesia Post Note  Patient: Juan Gallegos  Procedure(s) Performed: COLONOSCOPY WITH PROPOFOL (N/A ) POLYPECTOMY BIOPSY     Patient location during evaluation: PACU Anesthesia Type: MAC Level of consciousness: awake and alert Pain management: pain level controlled Vital Signs Assessment: post-procedure vital signs reviewed and stable Respiratory status: spontaneous breathing, nonlabored ventilation, respiratory function stable and patient connected to nasal cannula oxygen Cardiovascular status: stable and blood pressure returned to baseline Postop Assessment: no apparent nausea or vomiting Anesthetic complications: no    Last Vitals:  Vitals:   06/04/18 0810 06/04/18 0820  BP: 121/86 (!) 154/94  Pulse: 74 67  Resp: 20 16  Temp:    SpO2: 96% 98%    Last Pain:  Vitals:   06/04/18 0820  TempSrc:   PainSc: 0-No pain                 Tiajuana Amass

## 2018-06-05 ENCOUNTER — Encounter (HOSPITAL_COMMUNITY): Payer: Self-pay | Admitting: Gastroenterology

## 2018-06-23 MED FILL — TERBINAFINE HCL 250 MG TAB: 250 | 30 days supply | Qty: 30 | Fill #1

## 2018-06-25 MED FILL — buPROPion HCL ER (XL) 150 M: 150 | 30 days supply | Qty: 60 | Fill #1

## 2018-06-26 MED FILL — SERTRALINE HCL 100 MG TAB: 100 | 90 days supply | Qty: 90 | Fill #2

## 2018-06-29 MED FILL — ATORVASTATIN 20 MG TABLET: 20 | 30 days supply | Qty: 30 | Fill #9

## 2018-07-08 ENCOUNTER — Ambulatory Visit: Payer: 59 | Admitting: Pulmonary Disease

## 2018-07-10 ENCOUNTER — Telehealth: Payer: Self-pay | Admitting: Pulmonary Disease

## 2018-07-10 DIAGNOSIS — G4733 Obstructive sleep apnea (adult) (pediatric): Secondary | ICD-10-CM

## 2018-07-10 NOTE — Telephone Encounter (Signed)
ATC pt, no answer. Left message for pt to call back.  

## 2018-07-10 NOTE — Telephone Encounter (Signed)
Please order

## 2018-07-10 NOTE — Telephone Encounter (Signed)
Spoke with pt, he states [employer] needs the items below and he is requesting an order for cpap supplies. I can compose letter but can we print order for supplies. Please advise.   Notes, medical necessary, and Rx printed and mailed to his address on file.

## 2018-07-10 NOTE — Telephone Encounter (Signed)
Left message for patient to call back  

## 2018-07-10 NOTE — Telephone Encounter (Signed)
Pt is returning call. Cb is [phone].

## 2018-07-13 NOTE — Telephone Encounter (Signed)
lmom 

## 2018-07-14 NOTE — Telephone Encounter (Signed)
Please send letter of necessity. May send prescription and office note to patient as requested. I have never seen this patient. It looks like Dr. Elsworth Soho had already ordered the CPAP.

## 2018-07-14 NOTE — Telephone Encounter (Signed)
Spoke to patient to clarify what is needed to close this encounter. Patient states his employer can help him order cpap supplies but he needs a letter of medical necessity in order for his insurance to pay for this.  He also requested the copy of his visit notes and a printed prescription to be mailed to home address.  He has not received anything from [place] so far.  Address verified.  Routed to T. Nils Pyle, NP for assistance with medical necessity letter.   Juan Gallegos can you assist with this letter?

## 2018-07-14 NOTE — Telephone Encounter (Signed)
Letter of medical necessity created, office notes and cpap supply order printed and placed in outgoing mail to patient's home, per patient request.  Nothing further needed for this encounter.

## 2018-07-23 MED FILL — TERBINAFINE HCL 250 MG TAB: 250 | 24 days supply | Qty: 24 | Fill #2

## 2018-07-27 ENCOUNTER — Ambulatory Visit: Payer: 59 | Admitting: Pulmonary Disease

## 2018-08-03 MED FILL — ATORVASTATIN 20 MG TABLET: 20 | 30 days supply | Qty: 30 | Fill #10

## 2018-08-27 MED FILL — buPROPion HCL ER (XL) 150 M: 150 | 30 days supply | Qty: 30 | Fill #0

## 2018-08-29 MED FILL — ATORVASTATIN 20 MG TABLET: 20 | 30 days supply | Qty: 30 | Fill #11

## 2018-09-23 MED FILL — SERTRALINE HCL 100 MG TAB: 100 | 30 days supply | Qty: 30 | Fill #3

## 2018-09-23 MED FILL — buPROPion HCL ER (XL) 150 M: 150 | 30 days supply | Qty: 30 | Fill #1

## 2018-10-01 MED FILL — ATORVASTATIN 20 MG TABLET: 20 | 30 days supply | Qty: 30 | Fill #0

## 2018-10-16 MED FILL — HYDROCODON-APAP 5-325: 5-325 | 2 days supply | Qty: 6 | Fill #0

## 2018-10-16 MED FILL — DIAZEPAM 10 MG TABS: 10 | 1 days supply | Qty: 1 | Fill #0

## 2018-10-22 MED FILL — buPROPion HCL ER (XL) 150 M: 150 | 30 days supply | Qty: 30 | Fill #2

## 2018-10-22 MED FILL — SERTRALINE HCL 100 MG TAB: 100 | 30 days supply | Qty: 30 | Fill #4

## 2018-11-02 MED FILL — ATORVASTATIN 20 MG TABLET: 20 | 30 days supply | Qty: 30 | Fill #1

## 2018-11-23 MED FILL — buPROPion HCL ER (XL) 150 M: 150 | 30 days supply | Qty: 30 | Fill #3

## 2018-11-29 MED FILL — SERTRALINE HCL 100 MG TAB: 100 | 30 days supply | Qty: 30 | Fill #5

## 2018-11-29 MED FILL — ATORVASTATIN 20 MG TABLET: 20 | 30 days supply | Qty: 30 | Fill #2

## 2018-12-09 MED FILL — ATORVASTATIN 20 MG TABLET: 20 | 30 days supply | Qty: 30 | Fill #2

## 2018-12-09 MED FILL — SERTRALINE HCL 100 MG TAB: 100 | 30 days supply | Qty: 30 | Fill #5

## 2018-12-30 MED FILL — buPROPion HCL ER (XL) 150 M: 150 | 30 days supply | Qty: 30 | Fill #4

## 2019-01-04 MED FILL — ATORVASTATIN 20 MG TABLET: 20 | 30 days supply | Qty: 30 | Fill #3

## 2019-01-04 MED FILL — SERTRALINE HCL 100 MG TAB: 100 | 30 days supply | Qty: 30 | Fill #6

## 2019-01-06 ENCOUNTER — Other Ambulatory Visit: Payer: Self-pay | Admitting: Gastroenterology

## 2019-01-06 DIAGNOSIS — R1310 Dysphagia, unspecified: Secondary | ICD-10-CM

## 2019-01-08 ENCOUNTER — Ambulatory Visit
Admission: RE | Admit: 2019-01-08 | Discharge: 2019-01-08 | Disposition: A | Discharge status: Home / Self Care | Payer: 59 | Source: Ambulatory Visit | Attending: Gastroenterology | Admitting: Gastroenterology

## 2019-01-08 DIAGNOSIS — R1310 Dysphagia, unspecified: Secondary | ICD-10-CM

## 2019-01-21 MED FILL — FLUARIX QUADRIVALENT 0.5 ML: 0.5 | 1 days supply | Qty: 1 | Fill #0

## 2019-02-03 ENCOUNTER — Ambulatory Visit: Payer: 59 | Admitting: Pulmonary Disease

## 2019-02-03 MED FILL — SERTRALINE HCL 100 MG TAB: 100 | 30 days supply | Qty: 30 | Fill #7

## 2019-02-03 MED FILL — ATORVASTATIN 20 MG TABLET: 20 | 30 days supply | Qty: 30 | Fill #4

## 2019-02-03 MED FILL — buPROPion HCL ER (XL) 150 M: 150 | 30 days supply | Qty: 30 | Fill #5

## 2019-03-05 MED FILL — ATORVASTATIN 20 MG TABLET: 20 | 30 days supply | Qty: 30 | Fill #5

## 2019-03-05 MED FILL — SERTRALINE HCL 100 MG TAB: 100 | 30 days supply | Qty: 30 | Fill #8

## 2019-03-05 MED FILL — buPROPion HCL ER (XL) 150 M: 150 | 30 days supply | Qty: 30 | Fill #6

## 2019-06-01 ENCOUNTER — Institutional Professional Consult (permissible substitution): Payer: Self-pay | Admitting: Pulmonary Disease

## 2019-06-14 ENCOUNTER — Other Ambulatory Visit: Payer: Self-pay

## 2019-06-14 ENCOUNTER — Ambulatory Visit: Payer: 59 | Admitting: Pulmonary Disease

## 2019-06-14 ENCOUNTER — Encounter: Payer: Self-pay | Admitting: Pulmonary Disease

## 2019-06-14 DIAGNOSIS — G4733 Obstructive sleep apnea (adult) (pediatric): Secondary | ICD-10-CM | POA: Diagnosis not present

## 2019-06-14 NOTE — Progress Notes (Signed)
   Subjective:    Patient ID: Juan Gallegos, male    DOB: [birth year], 49 y.o.   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  He was seen 04/2018 and underwent home sleep test based on which he was placed on auto CPAP with nasal pillows.  He has been lost to compliance since then. He is referred back by his PCP for residual sleepiness to consider stimulants such as modafinil. He reports dramatic improvement in his daytime somnolence and fatigue since starting the sleep apnea settled down with nasal pillows. He does sound like a large mask over his face so would like to avoid full facemask.  He does report dryness of his mouth and he states that this is somewhat improved since using a new reservoir.  He has left employment with [employer] and is now working in an outpatient clinic and is able to get through his workday, denies problems driving.  He does have 1 bathroom visit at night for nocturia  He denies any problems with pressure but does report some abdominal bloating  He has gained 10 pounds over the last year  Significant tests/ events reviewed  05/2018 HST showed severe OSA, AHI 73/hour   Review of Systems neg for any significant sore throat, dysphagia, itching, sneezing, nasal congestion or excess/ purulent secretions, fever, chills, sweats, unintended wt loss, pleuritic or exertional cp, hempoptysis, orthopnea pnd or change in chronic leg swelling. Also denies presyncope, palpitations, heartburn, abdominal pain, nausea, vomiting, diarrhea or change in bowel or urinary habits, dysuria,hematuria, rash, arthralgias, visual complaints, headache, numbness weakness or ataxia.     Objective:   Physical Exam  Gen. Pleasant, obese, in no distress ENT - no lesions, no post nasal drip, overbite Neck: No JVD, no thyromegaly, no carotid bruits Lungs: no use of accessory muscles, no dullness to percussion, decreased without rales or rhonchi    Cardiovascular: Rhythm regular, heart sounds  normal, no murmurs or gallops, no peripheral edema Musculoskeletal: No deformities, no cyanosis or clubbing , no tremors       Assessment & Plan:

## 2019-06-14 NOTE — Patient Instructions (Signed)
  Please bring back the card from your machine so that we can review the CPAP report and give you feedback CPAP supplies will be renewed as needed We discussed possibility of air leak from the mouth, use a chinstrap

## 2019-06-14 NOTE — Assessment & Plan Note (Signed)
He Will bring back the card from your machine so that we can review the CPAP report and give you feedback CPAP supplies will be renewed as needed We discussed possibility of air leak from the mouth dryness, use a chinstrap.  If this does not work, he may need a full facemask.  We will review CPAP report to ensure that his AHI is completely treated For bloating, we will make sure pressure settings are not too high, and if needed provide EPR 2 to 3 cm  He does not seem to have excessive daytime sleepiness so do not feel that modafinil is necessary at this time.  We discussed use of stimulants and OSA and cardiac side effects  Weight loss encouraged, compliance with goal of at least 4-6 hrs every night is the expectation. Advised against medications with sedative side effects Cautioned against driving when sleepy - understanding that sleepiness will vary on a day to day basis

## 2019-06-17 ENCOUNTER — Telehealth: Payer: Self-pay | Admitting: General Surgery

## 2019-06-17 ENCOUNTER — Telehealth: Payer: Self-pay | Admitting: Pulmonary Disease

## 2019-06-17 NOTE — Telephone Encounter (Signed)
LVM for [facility] with [employer] to ask for her to check into the SD card for the patient CPAP machine, the card had not data.  In addition the patient stated when he was set up with the CPAP it was just before [facility] became [employer]. He stated he was told of an account change, so we are not sure if the patient was issued two separate accounts because he does not come up in [place].  In addition when I called [phone] I was told he could not be found in the system. So it is possible his account information did not transfer over correctly if at all and may have been lost. But we are not able to obtain download for the CPAP for Dr. Elsworth Soho to review.

## 2019-06-17 NOTE — Telephone Encounter (Signed)
Spoke with Melissa from [employer] she confirmed they only provided CPAP supplies for the patient. However, they had to leave the patient a voicemail on Wednesday, requesting the serial number and device number of his CPAP machine.  The patient can also call [phone] and select option # 1 or option # 4.  Melissa gave the information in case the patient contacts our office, so it can be provided.

## 2019-06-23 MED FILL — NOVOFINE 32G NEEDLES: 32G X 6 MM | 83 days supply | Qty: 100 | Fill #0

## 2019-06-23 MED FILL — SAXENDA 18 MG/3 ML PEN: 18 | 28 days supply | Qty: 9 | Fill #0

## 2019-09-01 ENCOUNTER — Other Ambulatory Visit: Payer: Self-pay

## 2019-09-01 ENCOUNTER — Ambulatory Visit (HOSPITAL_COMMUNITY)
Admission: RE | Admit: 2019-09-01 | Discharge: 2019-09-01 | Disposition: A | Discharge status: Home / Self Care | Payer: 59 | Source: Ambulatory Visit | Attending: Internal Medicine | Admitting: Internal Medicine

## 2019-09-01 ENCOUNTER — Other Ambulatory Visit: Payer: Self-pay | Admitting: Internal Medicine

## 2019-09-01 DIAGNOSIS — R112 Nausea with vomiting, unspecified: Secondary | ICD-10-CM | POA: Insufficient documentation

## 2019-09-02 MED FILL — ONDANSETRON HCL 8 MG TABLET: 8 | 2 days supply | Qty: 6 | Fill #0

## 2019-10-14 MED FILL — FAMOTIDINE 40 MG TABS: 40 | 30 days supply | Qty: 60 | Fill #0

## 2019-12-20 ENCOUNTER — Other Ambulatory Visit (HOSPITAL_COMMUNITY): Payer: Self-pay | Admitting: Gastroenterology

## 2019-12-20 ENCOUNTER — Other Ambulatory Visit: Payer: Self-pay | Admitting: Gastroenterology

## 2019-12-20 DIAGNOSIS — R1011 Right upper quadrant pain: Secondary | ICD-10-CM

## 2020-01-10 ENCOUNTER — Ambulatory Visit (HOSPITAL_COMMUNITY)
Admission: RE | Admit: 2020-01-10 | Discharge: 2020-01-10 | Disposition: A | Discharge status: Home / Self Care | Payer: 59 | Source: Ambulatory Visit | Attending: Gastroenterology | Admitting: Gastroenterology

## 2020-01-10 ENCOUNTER — Other Ambulatory Visit: Payer: Self-pay

## 2020-01-10 DIAGNOSIS — R1011 Right upper quadrant pain: Secondary | ICD-10-CM | POA: Insufficient documentation

## 2020-01-10 MED ORDER — TECHNETIUM TC 99M MEBROFENIN IV KIT
5.4000 | PACK | Freq: Once | INTRAVENOUS | Status: AC | PRN
  Administered 2020-01-10: 5.4 via INTRAVENOUS

## 2020-02-08 ENCOUNTER — Other Ambulatory Visit: Payer: Self-pay | Admitting: Surgery

## 2020-02-21 ENCOUNTER — Other Ambulatory Visit: Payer: Self-pay | Admitting: Surgery

## 2020-02-23 ENCOUNTER — Other Ambulatory Visit: Payer: Self-pay

## 2020-02-23 ENCOUNTER — Encounter (HOSPITAL_BASED_OUTPATIENT_CLINIC_OR_DEPARTMENT_OTHER): Payer: Self-pay | Admitting: Surgery

## 2020-02-25 NOTE — Progress Notes (Signed)
      Enhanced Recovery after Surgery for Orthopedics Enhanced Recovery after Surgery is a protocol used to improve the stress on your body and your recovery after surgery.  Patient Instructions  . The night before surgery:  o No food after midnight. ONLY clear liquids after midnight  . The day of surgery (if you do NOT have diabetes):  o Drink ONE (1) Pre-Surgery Clear Ensure as directed.   o This drink was given to you during your hospital  pre-op appointment visit. o The pre-op nurse will instruct you on the time to drink the  Pre-Surgery Ensure depending on your surgery time. o Finish the drink at the designated time by the pre-op nurse.  o Nothing else to drink after completing the  Pre-Surgery Clear Ensure.  . The day of surgery (if you have diabetes): o Drink ONE (1) Gatorade 2 (G2) as directed. o This drink was given to you during your hospital  pre-op appointment visit.  o The pre-op nurse will instruct you on the time to drink the   Gatorade 2 (G2) depending on your surgery time. o Color of the Gatorade may vary. Red is not allowed. o Nothing else to drink after completing the  Gatorade 2 (G2).         If you have questions, please contact your surgeon's office.  Surgical soap given. Instructions given. Patient verbalized understanding.

## 2020-03-03 ENCOUNTER — Other Ambulatory Visit (HOSPITAL_COMMUNITY)
Admission: RE | Admit: 2020-03-03 | Discharge: 2020-03-03 | Disposition: A | Discharge status: Home / Self Care | Payer: No Typology Code available for payment source | Source: Ambulatory Visit | Attending: Surgery | Admitting: Surgery

## 2020-03-03 DIAGNOSIS — Z01812 Encounter for preprocedural laboratory examination: Secondary | ICD-10-CM | POA: Insufficient documentation

## 2020-03-03 DIAGNOSIS — Z20822 Contact with and (suspected) exposure to covid-19: Secondary | ICD-10-CM | POA: Diagnosis not present

## 2020-03-03 LAB — SARS CORONAVIRUS 2 (TAT 6-24 HRS): SARS Coronavirus 2: NEGATIVE

## 2020-03-05 NOTE — H&P (Signed)
Juan Gallegos Appointment: 02/08/2020 11:30 AM Location: [facility] Patient #: [id number] DOB: 1971/01/26 Married / Language: Juan Gallegos / Race: White Male   History of Present Illness (Kendallyn Lippold A. Ninfa Linden MD; 02/08/2020 11:34 AM) The patient is a 49 year old male who presents with abdominal pain.  Chief complaint: Right upper quadrant abdominal pain with nausea and vomiting  This is a 49 year old gentleman referred by Dr. Collene Mares for the above-mentioned complaints. He has been having right upper quadrant abdominal pain with nausea and vomiting after fatty meals for almost a year. It started out subtly but is now having daily. He first had pain that is now having rectal quadrant abdominal pain with occasional nausea and vomiting. The patient is moderate and sharp in nature. He originally had an ultrasound which is unremarkable with no evidence of gallstones. He then had a HIDA scan showing an 8% gallbladder ejection fraction. He reports a gallbladder disease runs in his family. He is otherwise healthy without complaints.   Past Surgical History Darden Palmer, [place]; 02/08/2020 11:23 AM) Oral Surgery   Diagnostic Studies History Lattie Haw Jackson, [place]; 02/08/2020 11:23 AM) Colonoscopy  1-5 years ago  Allergies Darden Palmer, RMA; 02/08/2020 11:24 AM) PredniSONE (Pak) *CORTICOSTEROIDS*  Allergies Reconciled   Medication History Darden Palmer, [place]; 02/08/2020 11:27 AM) Atorvastatin Calcium (20MG  Tablet, Oral) Active. buPROPion HCl ER (XL) (150MG  Tablet ER 24HR, Oral) Active. Sertraline HCl (100MG  Tablet, Oral) Active. buPROPion HCl ER (SR) (150MG  Tablet ER 12HR, Oral) Active. Cetirizine HCl (10MG  Tablet, Oral) Active. Fluticasone Propionate (50MCG/ACT Suspension, Nasal) Active. Famotidine (40MG  Tablet, Oral) Active.  Other Problems Darden Palmer, [place]; 02/08/2020 11:23 AM) Anxiety Disorder  Cholelithiasis  Heart murmur  Hypercholesterolemia     Review of  Systems Lattie Haw Caldwell RMA; 02/08/2020 11:23 AM) General Not Present- Appetite Loss, Chills, Fatigue, Fever, Night Sweats, Weight Gain and Weight Loss. Skin Not Present- Change in Wart/Mole, Dryness, Hives, Jaundice, New Lesions, Non-Healing Wounds, Rash and Ulcer. HEENT Present- Ringing in the Ears and Seasonal Allergies. Not Present- Earache, Hearing Loss, Hoarseness, Nose Bleed, Oral Ulcers, Sinus Pain, Sore Throat, Visual Disturbances, Wears glasses/contact lenses and Yellow Eyes. Respiratory Not Present- Bloody sputum, Chronic Cough, Difficulty Breathing, Snoring and Wheezing. Breast Not Present- Breast Mass, Breast Pain, Nipple Discharge and Skin Changes. Cardiovascular Not Present- Chest Pain, Difficulty Breathing Lying Down, Leg Cramps, Palpitations, Rapid Heart Rate, Shortness of Breath and Swelling of Extremities. Gastrointestinal Present- Abdominal Pain, Bloating and Excessive gas. Not Present- Bloody Stool, Change in Bowel Habits, Chronic diarrhea, Constipation, Difficulty Swallowing, Gets full quickly at meals, Hemorrhoids, Indigestion, Nausea, Rectal Pain and Vomiting. Male Genitourinary Not Present- Blood in Urine, Change in Urinary Stream, Frequency, Impotence, Nocturia, Painful Urination, Urgency and Urine Leakage. Musculoskeletal Not Present- Back Pain, Joint Pain, Joint Stiffness, Muscle Pain, Muscle Weakness and Swelling of Extremities. Neurological Not Present- Decreased Memory, Fainting, Headaches, Numbness, Seizures, Tingling, Tremor, Trouble walking and Weakness. Psychiatric Not Present- Anxiety, Bipolar, Change in Sleep Pattern, Depression, Fearful and Frequent crying. Endocrine Not Present- Cold Intolerance, Excessive Hunger, Hair Changes, Heat Intolerance, Hot flashes and New Diabetes. Hematology Not Present- Blood Thinners, Easy Bruising, Excessive bleeding, Gland problems, HIV and Persistent Infections.  Vitals Lattie Haw Lesslie RMA; 02/08/2020 11:27 AM) 02/08/2020 11:27  AM Weight: 286.13 lb Height: 70in Body Surface Area: 2.43 m Body Mass Index: 41.05 kg/m  Temp.: 98.24F  Pulse: 82 (Regular)  P.OX: 97% (Room air) BP: 126/88(Sitting, Left Arm, Standard)       Physical Exam (Landan Fedie A. Ninfa Linden MD; 02/08/2020 11:35  AM) The physical exam findings are as follows: Note: He appears well and exam.  His abdomen is soft and obese. There is tenderness with some guarding in the right upper quadrant. There are no hernias. There is no hepatomegaly.    Assessment & Plan (Kyley Laurel A. Ninfa Linden MD; 02/08/2020 11:36 AM) BILIARY DYSKINESIA (K82.8) Impression: I reviewed his notes and electronic medical records. I reviewed his HIDA scan and ultrasound. He has biliary dyskinesia and suspect chronic cholecystitis. I discussed this with him in detail. Given the degree of his symptoms and exam, laparoscopic cholecystectomy is strongly recommended and he is eager to proceed with surgery. I discussed the procedure in detail. The patient was given Neurosurgeon. We discussed the risks and benefits of a laparoscopic cholecystectomy and possible cholangiogram including, but not limited to, bleeding, infection, injury to surrounding structures such as the intestine or liver, bile leak, retained gallstones, need to convert to an open procedure, prolonged diarrhea, blood clots such as DVT, common bile duct injury, anesthesia risks, and possible need for additional procedures. The likelihood of improvement in symptoms and return to the patient's normal status is good. We discussed the typical post-operative recovery course. All questions were answered.

## 2020-03-06 ENCOUNTER — Other Ambulatory Visit: Payer: Self-pay

## 2020-03-06 ENCOUNTER — Encounter (HOSPITAL_BASED_OUTPATIENT_CLINIC_OR_DEPARTMENT_OTHER)
Admission: RE | Disposition: A | Discharge status: Home / Self Care | Payer: Self-pay | Source: Home / Self Care | Attending: Surgery

## 2020-03-06 ENCOUNTER — Encounter (HOSPITAL_BASED_OUTPATIENT_CLINIC_OR_DEPARTMENT_OTHER): Payer: Self-pay | Admitting: Surgery

## 2020-03-06 ENCOUNTER — Ambulatory Visit (HOSPITAL_BASED_OUTPATIENT_CLINIC_OR_DEPARTMENT_OTHER): Payer: No Typology Code available for payment source | Admitting: Anesthesiology

## 2020-03-06 ENCOUNTER — Ambulatory Visit (HOSPITAL_BASED_OUTPATIENT_CLINIC_OR_DEPARTMENT_OTHER)
Admission: RE | Admit: 2020-03-06 | Discharge: 2020-03-06 | Disposition: A | Discharge status: Home / Self Care | Payer: No Typology Code available for payment source | Attending: Surgery | Admitting: Surgery

## 2020-03-06 ENCOUNTER — Other Ambulatory Visit (HOSPITAL_COMMUNITY): Payer: Self-pay | Admitting: Surgery

## 2020-03-06 DIAGNOSIS — K828 Other specified diseases of gallbladder: Secondary | ICD-10-CM | POA: Diagnosis not present

## 2020-03-06 DIAGNOSIS — Z888 Allergy status to other drugs, medicaments and biological substances status: Secondary | ICD-10-CM | POA: Insufficient documentation

## 2020-03-06 HISTORY — PX: CHOLECYSTECTOMY: SHX55

## 2020-03-06 HISTORY — DX: Sleep apnea, unspecified: G47.30

## 2020-03-06 HISTORY — DX: Gastro-esophageal reflux disease without esophagitis: K21.9

## 2020-03-06 HISTORY — DX: Anxiety disorder, unspecified: F41.9

## 2020-03-06 SURGERY — LAPAROSCOPIC CHOLECYSTECTOMY
Anesthesia: General | Site: Abdomen

## 2020-03-06 MED ORDER — DEXAMETHASONE SODIUM PHOSPHATE 10 MG/ML IJ SOLN
INTRAMUSCULAR | Status: AC
  Filled 2020-03-06: qty 1

## 2020-03-06 MED ORDER — HYDROMORPHONE HCL 1 MG/ML IJ SOLN
INTRAMUSCULAR | Status: AC
  Filled 2020-03-06: qty 0.5

## 2020-03-06 MED ORDER — LIDOCAINE 2% (20 MG/ML) 5 ML SYRINGE
INTRAMUSCULAR | Status: AC
  Filled 2020-03-06: qty 5

## 2020-03-06 MED ORDER — OXYCODONE HCL 5 MG PO TABS
5.0000 mg | ORAL_TABLET | Freq: Four times a day (QID) | ORAL | 0 refills | Status: AC | PRN
Start: 2020-03-06 — End: ?

## 2020-03-06 MED ORDER — HYDROMORPHONE HCL 1 MG/ML IJ SOLN
0.2500 mg | INTRAMUSCULAR | Status: DC | PRN
  Administered 2020-03-06 (×3): 0.5 mg via INTRAVENOUS

## 2020-03-06 MED ORDER — ACETAMINOPHEN 500 MG PO TABS
ORAL_TABLET | ORAL | Status: AC
  Filled 2020-03-06: qty 2

## 2020-03-06 MED ORDER — BUPIVACAINE-EPINEPHRINE (PF) 0.5% -1:200000 IJ SOLN
INTRAMUSCULAR | Status: DC | PRN
  Administered 2020-03-06: 20 mL

## 2020-03-06 MED ORDER — ONDANSETRON HCL 4 MG/2ML IJ SOLN
INTRAMUSCULAR | Status: AC
  Filled 2020-03-06: qty 2

## 2020-03-06 MED ORDER — CELECOXIB 200 MG PO CAPS
400.0000 mg | ORAL_CAPSULE | ORAL | Status: AC
  Administered 2020-03-06: 400 mg via ORAL

## 2020-03-06 MED ORDER — SUGAMMADEX SODIUM 200 MG/2ML IV SOLN
INTRAVENOUS | Status: DC | PRN
  Administered 2020-03-06: 260 mg via INTRAVENOUS

## 2020-03-06 MED ORDER — DEXAMETHASONE SODIUM PHOSPHATE 10 MG/ML IJ SOLN
INTRAMUSCULAR | Status: DC | PRN
  Administered 2020-03-06: 4 mg via INTRAVENOUS

## 2020-03-06 MED ORDER — AMISULPRIDE (ANTIEMETIC) 5 MG/2ML IV SOLN
10.0000 mg | Freq: Once | INTRAVENOUS | Status: AC | PRN
  Administered 2020-03-06: 10 mg via INTRAVENOUS

## 2020-03-06 MED ORDER — GABAPENTIN 300 MG PO CAPS
ORAL_CAPSULE | ORAL | Status: AC
  Filled 2020-03-06: qty 1

## 2020-03-06 MED ORDER — CELECOXIB 200 MG PO CAPS
ORAL_CAPSULE | ORAL | Status: AC
  Filled 2020-03-06: qty 2

## 2020-03-06 MED ORDER — MIDAZOLAM HCL 5 MG/5ML IJ SOLN
INTRAMUSCULAR | Status: DC | PRN
  Administered 2020-03-06: 2 mg via INTRAVENOUS

## 2020-03-06 MED ORDER — FENTANYL CITRATE (PF) 100 MCG/2ML IJ SOLN
INTRAMUSCULAR | Status: AC
  Filled 2020-03-06: qty 2

## 2020-03-06 MED ORDER — SODIUM CHLORIDE 0.9 % IR SOLN
Status: DC | PRN
  Administered 2020-03-06: 1000 mL

## 2020-03-06 MED ORDER — CEFAZOLIN SODIUM-DEXTROSE 2-4 GM/100ML-% IV SOLN
INTRAVENOUS | Status: AC
  Filled 2020-03-06: qty 100

## 2020-03-06 MED ORDER — ACETAMINOPHEN 500 MG PO TABS
1000.0000 mg | ORAL_TABLET | ORAL | Status: AC
  Administered 2020-03-06: 1000 mg via ORAL

## 2020-03-06 MED ORDER — PROPOFOL 10 MG/ML IV BOLUS
INTRAVENOUS | Status: AC
  Filled 2020-03-06: qty 20

## 2020-03-06 MED ORDER — CHLORHEXIDINE GLUCONATE CLOTH 2 % EX PADS: 6.0000 | MEDICATED_PAD | Freq: Once | CUTANEOUS | Status: DC

## 2020-03-06 MED ORDER — MIDAZOLAM HCL 2 MG/2ML IJ SOLN
INTRAMUSCULAR | Status: AC
  Filled 2020-03-06: qty 2

## 2020-03-06 MED ORDER — FENTANYL CITRATE (PF) 250 MCG/5ML IJ SOLN
INTRAMUSCULAR | Status: DC | PRN
  Administered 2020-03-06 (×2): 50 ug via INTRAVENOUS

## 2020-03-06 MED ORDER — MEPERIDINE HCL 25 MG/ML IJ SOLN: 6.2500 mg | INTRAMUSCULAR | Status: DC | PRN

## 2020-03-06 MED ORDER — OXYCODONE HCL 5 MG/5ML PO SOLN: 5.0000 mg | Freq: Once | ORAL | Status: DC | PRN

## 2020-03-06 MED ORDER — LACTATED RINGERS IV SOLN: INTRAVENOUS | Status: DC

## 2020-03-06 MED ORDER — PROMETHAZINE HCL 25 MG/ML IJ SOLN
INTRAMUSCULAR | Status: AC
  Filled 2020-03-06: qty 1

## 2020-03-06 MED ORDER — CEFAZOLIN SODIUM-DEXTROSE 2-4 GM/100ML-% IV SOLN
2.0000 g | INTRAVENOUS | Status: AC
  Administered 2020-03-06: 3 g via INTRAVENOUS

## 2020-03-06 MED ORDER — OXYCODONE HCL 5 MG PO TABS: 5.0000 mg | ORAL_TABLET | Freq: Once | ORAL | Status: DC | PRN

## 2020-03-06 MED ORDER — AMISULPRIDE (ANTIEMETIC) 5 MG/2ML IV SOLN
INTRAVENOUS | Status: AC
  Filled 2020-03-06: qty 2

## 2020-03-06 MED ORDER — ONDANSETRON HCL 4 MG/2ML IJ SOLN
INTRAMUSCULAR | Status: DC | PRN
  Administered 2020-03-06: 4 mg via INTRAVENOUS

## 2020-03-06 MED ORDER — LIDOCAINE 2% (20 MG/ML) 5 ML SYRINGE
INTRAMUSCULAR | Status: DC | PRN
  Administered 2020-03-06: 100 mg via INTRAVENOUS

## 2020-03-06 MED ORDER — ROCURONIUM BROMIDE 10 MG/ML (PF) SYRINGE
PREFILLED_SYRINGE | INTRAVENOUS | Status: DC | PRN
  Administered 2020-03-06: 100 mg via INTRAVENOUS

## 2020-03-06 MED ORDER — PROPOFOL 10 MG/ML IV BOLUS
INTRAVENOUS | Status: DC | PRN
  Administered 2020-03-06: 200 mg via INTRAVENOUS

## 2020-03-06 MED ORDER — GABAPENTIN 300 MG PO CAPS
300.0000 mg | ORAL_CAPSULE | ORAL | Status: AC
  Administered 2020-03-06: 300 mg via ORAL

## 2020-03-06 MED ORDER — PROMETHAZINE HCL 25 MG/ML IJ SOLN
6.2500 mg | INTRAMUSCULAR | Status: DC | PRN
  Administered 2020-03-06: 6.25 mg via INTRAVENOUS

## 2020-03-06 MED ORDER — EPHEDRINE SULFATE-NACL 50-0.9 MG/10ML-% IV SOSY
PREFILLED_SYRINGE | INTRAVENOUS | Status: DC | PRN
  Administered 2020-03-06: 10 mg via INTRAVENOUS

## 2020-03-06 MED ORDER — ROCURONIUM BROMIDE 10 MG/ML (PF) SYRINGE
PREFILLED_SYRINGE | INTRAVENOUS | Status: AC
  Filled 2020-03-06: qty 10

## 2020-03-06 MED FILL — oxyCODONE HCL 5 MG TABS: 5 | 6 days supply | Qty: 25 | Fill #0

## 2020-03-06 SURGICAL SUPPLY — 34 items
APPLIER CLIP 5 13 M/L LIGAMAX5 (MISCELLANEOUS) ×2
BLADE CLIPPER SURG (BLADE) ×2 IMPLANT
CABLE HIGH FREQUENCY MONO STRZ (ELECTRODE) ×2 IMPLANT
CHLORAPREP W/TINT 26 (MISCELLANEOUS) ×2 IMPLANT
CLIP APPLIE 5 13 M/L LIGAMAX5 (MISCELLANEOUS) ×1 IMPLANT
COVER MAYO STAND STRL (DRAPES) IMPLANT
COVER WAND RF STERILE (DRAPES) IMPLANT
DECANTER SPIKE VIAL GLASS SM (MISCELLANEOUS) IMPLANT
DERMABOND ADVANCED (GAUZE/BANDAGES/DRESSINGS) ×1
DERMABOND ADVANCED .7 DNX12 (GAUZE/BANDAGES/DRESSINGS) ×1 IMPLANT
DRAPE C-ARM 42X72 X-RAY (DRAPES) IMPLANT
DRAPE LAPAROSCOPIC ABDOMINAL (DRAPES) IMPLANT
ELECT REM PT RETURN 9FT ADLT (ELECTROSURGICAL) ×2
ELECTRODE REM PT RTRN 9FT ADLT (ELECTROSURGICAL) ×1 IMPLANT
GLOVE SURG SIGNA 7.5 PF LTX (GLOVE) ×2 IMPLANT
GOWN STRL REUS W/ TWL LRG LVL3 (GOWN DISPOSABLE) ×2 IMPLANT
GOWN STRL REUS W/ TWL XL LVL3 (GOWN DISPOSABLE) ×2 IMPLANT
GOWN STRL REUS W/TWL LRG LVL3 (GOWN DISPOSABLE) ×2
GOWN STRL REUS W/TWL XL LVL3 (GOWN DISPOSABLE) ×2
NS IRRIG 1000ML POUR BTL (IV SOLUTION) ×2 IMPLANT
PACK BASIN DAY SURGERY FS (CUSTOM PROCEDURE TRAY) ×2 IMPLANT
POUCH SPECIMEN RETRIEVAL 10MM (ENDOMECHANICALS) ×2 IMPLANT
SCISSORS LAP 5X35 DISP (ENDOMECHANICALS) ×2 IMPLANT
SET CHOLANGIOGRAPH 5 50 .035 (SET/KITS/TRAYS/PACK) IMPLANT
SET IRRIG TUBING LAPAROSCOPIC (IRRIGATION / IRRIGATOR) ×2 IMPLANT
SET TUBE SMOKE EVAC HIGH FLOW (TUBING) ×2 IMPLANT
SLEEVE ENDOPATH XCEL 5M (ENDOMECHANICALS) ×4 IMPLANT
SLEEVE SCD COMPRESS KNEE MED (MISCELLANEOUS) ×2 IMPLANT
SUT MON AB 4-0 PC3 18 (SUTURE) ×2 IMPLANT
TOWEL GREEN STERILE FF (TOWEL DISPOSABLE) ×2 IMPLANT
TRAY LAPAROSCOPIC (CUSTOM PROCEDURE TRAY) ×2 IMPLANT
TROCAR XCEL BLUNT TIP 100MML (ENDOMECHANICALS) ×2 IMPLANT
TROCAR XCEL NON-BLD 5MMX100MML (ENDOMECHANICALS) ×2 IMPLANT
TUBE CONNECTING 20X1/4 (TUBING) ×2 IMPLANT

## 2020-03-06 NOTE — Anesthesia Procedure Notes (Signed)
Procedure Name: Intubation Performed by: Milford Cage, CRNA Pre-anesthesia Checklist: Patient identified, Emergency Drugs available, Suction available and Patient being monitored Patient Re-evaluated:Patient Re-evaluated prior to induction Oxygen Delivery Method: Circle System Utilized Preoxygenation: Pre-oxygenation with 100% oxygen Induction Type: IV induction Ventilation: Mask ventilation without difficulty and Oral airway inserted - appropriate to patient size Laryngoscope Size: Mac and 3 Grade View: Grade II Tube type: Oral Tube size: 7.0 mm Number of attempts: 1 Airway Equipment and Method: Stylet and Oral airway Placement Confirmation: ETT inserted through vocal cords under direct vision,  positive ETCO2 and breath sounds checked- equal and bilateral Secured at: 23 cm Tube secured with: Tape Dental Injury: Teeth and Oropharynx as per pre-operative assessment

## 2020-03-06 NOTE — Transfer of Care (Signed)
Immediate Anesthesia Transfer of Care Note  Patient: Juan Gallegos  Procedure(s) Performed: LAPAROSCOPIC CHOLECYSTECTOMY (N/A Abdomen)  Patient Location: PACU  Anesthesia Type:General  Level of Consciousness: awake  Airway & Oxygen Therapy: Patient Spontanous Breathing  Post-op Assessment: Report given to RN and Post -op Vital signs reviewed and stable  Post vital signs: Reviewed and stable  Last Vitals:  Vitals Value Taken Time  BP 127/68 03/06/20 1125  Temp    Pulse 73 03/06/20 1126  Resp 28 03/06/20 1126  SpO2 94 % 03/06/20 1126  Vitals shown include unvalidated device data.  Last Pain:  Vitals:   03/06/20 0817  TempSrc: Oral  PainSc: 5       Patients Stated Pain Goal: 5 (58/72/76 1848)  Complications: No complications documented.

## 2020-03-06 NOTE — Anesthesia Postprocedure Evaluation (Signed)
Anesthesia Post Note  Patient: Juan Gallegos  Procedure(s) Performed: LAPAROSCOPIC CHOLECYSTECTOMY (N/A Abdomen)     Patient location during evaluation: PACU Anesthesia Type: General Level of consciousness: awake and alert Pain management: pain level controlled Vital Signs Assessment: post-procedure vital signs reviewed and stable Respiratory status: spontaneous breathing, nonlabored ventilation and respiratory function stable Cardiovascular status: blood pressure returned to baseline and stable Postop Assessment: no apparent nausea or vomiting Anesthetic complications: no   No complications documented.  Last Vitals:  Vitals:   03/06/20 0817 03/06/20 1125  BP: 127/71 (!) 132/115  Pulse: 69 77  Resp: 18 (!) 25  Temp: 36.6 C 36.5 C  SpO2: 97% 93%    Last Pain:  Vitals:   03/06/20 1130  TempSrc:   PainSc: [place]

## 2020-03-06 NOTE — Anesthesia Preprocedure Evaluation (Signed)
Anesthesia Evaluation  Patient identified by MRN, date of birth, ID band Patient awake    Reviewed: Allergy & Precautions, NPO status , Patient's Chart, lab work & pertinent test results  Airway Mallampati: III  TM Distance: >3 FB Neck ROM: Full    Dental  (+) Dental Advisory Given   Pulmonary sleep apnea and Continuous Positive Airway Pressure Ventilation ,    breath sounds clear to auscultation       Cardiovascular negative cardio ROS   Rhythm:Regular Rate:Normal     Neuro/Psych Anxiety negative neurological ROS     GI/Hepatic Neg liver ROS, GERD  ,  Endo/Other  Morbid obesity  Renal/GU negative Renal ROS     Musculoskeletal   Abdominal (+) + obese,   Peds  Hematology negative hematology ROS (+)   Anesthesia Other Findings   Reproductive/Obstetrics                             Anesthesia Physical  Anesthesia Plan  ASA: III  Anesthesia Plan: General   Post-op Pain Management:    Induction: Intravenous  PONV Risk Score and Plan: 2 and Ondansetron, Treatment may vary due to age or medical condition and Midazolam  Airway Management Planned: Oral ETT  Additional Equipment:   Intra-op Plan:   Post-operative Plan: Extubation in OR  Informed Consent: I have reviewed the patients History and Physical, chart, labs and discussed the procedure including the risks, benefits and alternatives for the proposed anesthesia with the patient or authorized representative who has indicated his/her understanding and acceptance.     Dental advisory given  Plan Discussed with: CRNA  Anesthesia Plan Comments:         Anesthesia Quick Evaluation

## 2020-03-06 NOTE — Op Note (Signed)
Laparoscopic Cholecystectomy Procedure Note  Indications:This patient presents with symptomatic gallbladder disease and will undergo laparoscopic cholecystectomy.  Pre-operative Diagnosis:Biliary Dyskinesia   Post-operative Diagnosis:Same  Surgeon:Douglas Ninfa Linden, MD  Assistants:Levonne Carreras Lenore Cordia, MD  Anesthesia:General endotracheal anesthesia  ASA Class:2  Procedure Details The patient was seen again in the Holding Room. The risks, benefits, complications, treatment options, and expected outcomes were discussed with the patient. The possibilities of reaction to medication, pulmonary aspiration, perforation of viscus, bleeding, recurrent infection, finding a normal gallbladder, the need for additional procedures, failure to diagnose a condition, the possible need to convert to an open procedure, and creating a complication requiring transfusion or operation were discussed with the patient. The likelihood of improving the patient's symptoms with return to their baseline status is good. The patient and/or family concurred with the proposed plan, giving informed consent. The site of surgery properly noted. The patient was taken to Operating Room, identifiedand the procedure verified as Laparoscopic Cholecystectomy. A Time Out was held and the above information confirmed.  Prior to the induction of general anesthesia, antibiotic prophylaxis was administered. General endotracheal anesthesia was then administered and tolerated well. After the induction, the abdomen was prepped with Chloraprep and draped in sterile fashion. The patient was positioned in the supine position.  A 1.5 cm vertical supraumbilical incision was made.We dissected down to the abdominal fascia with blunt dissection. The fascia was incised vertically and we entered the peritoneal cavity bluntly. A pursestring suture of 0-Vicryl was placed around the fascial opening. The Hasson cannula was inserted and secured  with the stay suture. Pneumoperitoneum was then created with CO2 and tolerated well without any adverse changes in the patient's vital signs. A 5-mm port was placed in the subxiphoid position. Two 5-mm ports were placed in the right upper quadrant.  We positioned the patient in reverse Trendelenburg, tilted slightly to the patient's left. The gallbladder was identified, the fundus grasped and retracted cephalad. Adhesions were lysed bluntly and with the electrocautery where indicated, taking care not to injure any adjacent organs or viscus. The infundibulum was grasped and retracted laterally, exposing the peritoneum overlying the triangle of Calot. This was then divided and exposed in a blunt fashion. The cystic duct was clearly identified and bluntly dissected circumferentially. A critical view of the cystic duct and cystic artery was obtained. The cystic duct was then ligated with clips and divided. The cystic artery was, dissected free, ligated with clips and divided as well.  The gallbladder was dissected from the liver bed in retrograde fashion with the electrocautery. The gallbladder was removed and placed in an Endocatch sac. The liver bed was irrigated and inspected. Hemostasis was achieved with the electrocautery. The gallbladder and Endocatch sac were then removed through the umbilical port site. The pursestring suture was used to close the umbilical fascia.  We again inspected the right upper quadrant for hemostasis. Copious irrigation was used in the right upper quadrant until clear effluent was observed.Pneumoperitoneum was released as we removed the trocars. Local anesthetic was administered at all trocar sites.4-0 Monocryl was used to close the skin and surgical glue was applied. The patient was then extubated and brought to the recovery room in stable condition. Instrument, sponge, and needle counts were correct at closure and at the conclusion of the case.   Findings: Normal  appearing gallbladder  Estimated Blood Loss:5 ml  Drains:None  Specimens:Gallbladder   Complications:None; patient tolerated the procedure well.  Disposition:PACU - hemodynamically stable.  Condition:stable

## 2020-03-06 NOTE — Interval H&P Note (Signed)
History and Physical Interval Note:no change in H and P  03/06/2020 8:23 AM  Juan Gallegos  has presented today for surgery, with the diagnosis of BILIARY DYSKINESIA.  The various methods of treatment have been discussed with the patient and family. After consideration of risks, benefits and other options for treatment, the patient has consented to  Procedure(s): LAPAROSCOPIC CHOLECYSTECTOMY (N/A) as a surgical intervention.  The patient's history has been reviewed, patient examined, no change in status, stable for surgery.  I have reviewed the patient's chart and labs.  Questions were answered to the patient's satisfaction.     Coralie Keens

## 2020-03-06 NOTE — Discharge Instructions (Signed)
[facility] LAPAROS[facility] SUR[facility]ERY: POST OP INSTRUCTIONS Always review your discharge instruction sheet given to you by the facility where your surgery was performed. IF YOU HAVE DISABILITY OR FAMILY LEAVE FORMS, YOU MUST BRING THEM TO THE OFFICE FOR PROCESSING.   DO NOT GIVE THEM TO YOUR DOCTOR.  Next dose of Tylenol or Ibuprofen at 2:30 PM   1. A prescription for pain medication may be given to you upon discharge.  Take your pain medication as prescribed, if needed.  If narcotic pain medicine is not needed, then you may take acetaminophen (Tylenol) or ibuprofen (Advil) as needed. 2. Take your usually prescribed medications unless otherwise directed. 3. If you need a refill on your pain medication, please contact your pharmacy.  They will contact our office to request authorization. Prescriptions will not be filled after 5pm or on week-ends. 4. You should follow a light diet the first few days after arrival home, such as soup and crackers, etc.  Be sure to include lots of fluids daily. 5. Most patients will experience some swelling and bruising in the area of the incisions.  Ice packs will help.  Swelling and bruising can take several days to resolve.  6. It is common to experience some constipation if taking pain medication after surgery.  Increasing fluid intake and taking a stool softener (such as Colace) will usually help or prevent this problem from occurring.  A mild laxative (Milk of Magnesia or Miralax) should be taken according to package instructions if there are no bowel movements after 48 hours. 7. Unless discharge instructions indicate otherwise, you may remove your bandages 24-48 hours after surgery, and you may shower at that time.  You may have steri-strips (small skin tapes) in place directly over the incision.  These strips should be left on the skin for 7-10 days.  If your surgeon used skin glue on the incision, you may shower in 24 hours.  The glue will  flake off over the next 2-3 weeks.  Any sutures or staples will be removed at the office during your follow-up visit. 8. ACTIVITIES:  You may resume regular (light) daily activities beginning the next day--such as daily self-care, walking, climbing stairs--gradually increasing activities as tolerated.  You may have sexual intercourse when it is comfortable.  Refrain from any heavy lifting or straining until approved by your doctor. a. You may drive when you are no longer taking prescription pain medication, you can comfortably wear a seatbelt, and you can safely maneuver your car and apply brakes. b. RETURN TO WORK:  __________________________________________________________ 9. You should see your doctor in the office for a follow-up appointment approximately 2-3 weeks after your surgery.  Make sure that you call for this appointment within a day or two after you arrive home to insure a convenient appointment time. 10. OTHER INSTRUCTIONS:OK TO SHOWER STARTING TOMORROW 11. ICE PACK, TYLENOL, AND IBUPROFEN ALSO FOR PAIN 12. NO LIFTING MORE THAN 15 POUNDS FOR 2 WEEKS __________________________________________________________________________________________________________________________ __________________________________________________________________________________________________________________________ WHEN TO CALL YOUR DOCTOR: 1. Fever over 101.0 2. Inability to urinate 3. Continued bleeding from incision. 4. Increased pain, redness, or drainage from the incision. 5. Increasing abdominal pain  The clinic staff is available to answer your questions during regular business hours.  Please don't hesitate to call and ask to speak to one of the nurses for clinical concerns.  If you have a medical emergency, go to the nearest emergency room or call 911.  A surgeon from [facility] is always  on call at the hospital. [address] [place], [place], [place]  [address] ? [address],  [place], [place]   [address] [phone] ? [phone] ? FAX [phone] Web site: [email or web address]    Post Anesthesia Home Care Instructions  Activity: Get plenty of rest for the remainder of the day. A responsible individual must stay with you for 24 hours following the procedure.  For the next 24 hours, DO NOT: -Drive a car -Paediatric nurse -Drink alcoholic beverages -Take any medication unless instructed by your physician -Make any legal decisions or sign important papers.  Meals: Start with liquid foods such as gelatin or soup. Progress to regular foods as tolerated. Avoid greasy, spicy, heavy foods. If nausea and/or vomiting occur, drink only clear liquids until the nausea and/or vomiting subsides. Call your physician if vomiting continues.  Special Instructions/Symptoms: Your throat may feel dry or sore from the anesthesia or the breathing tube placed in your throat during surgery. If this causes discomfort, gargle with warm salt water. The discomfort should disappear within 24 hours.  If you had a scopolamine patch placed behind your ear for the management of post- operative nausea and/or vomiting:  1. The medication in the patch is effective for 72 hours, after which it should be removed.  Wrap patch in a tissue and discard in the trash. Wash hands thoroughly with soap and water. 2. You may remove the patch earlier than 72 hours if you experience unpleasant side effects which may include dry mouth, dizziness or visual disturbances. 3. Avoid touching the patch. Wash your hands with soap and water after contact with the patch.

## 2020-03-07 ENCOUNTER — Encounter (HOSPITAL_BASED_OUTPATIENT_CLINIC_OR_DEPARTMENT_OTHER): Payer: Self-pay | Admitting: Surgery

## 2020-03-07 LAB — SURGICAL PATHOLOGY

## 2020-04-13 ENCOUNTER — Other Ambulatory Visit (HOSPITAL_COMMUNITY): Payer: Self-pay

## 2020-04-13 MED FILL — ONDANSETRON HCL 8 MG TABLET: 8 | 10 days supply | Qty: 30 | Fill #0

## 2020-05-11 ENCOUNTER — Other Ambulatory Visit (HOSPITAL_COMMUNITY): Payer: Self-pay | Admitting: Internal Medicine

## 2020-05-11 MED FILL — AZITHROMYCIN 250 MG TABLET: 250 | 5 days supply | Qty: 6 | Fill #0

## 2020-05-11 MED FILL — BENZONATATE 200 MG CAP: 200 | 14 days supply | Qty: 42 | Fill #0

## 2020-07-05 ENCOUNTER — Other Ambulatory Visit (HOSPITAL_COMMUNITY): Payer: Self-pay | Admitting: Internal Medicine

## 2020-07-05 MED FILL — buPROPion HCL ER (XL) 150 M: 150 | 90 days supply | Qty: 90 | Fill #0

## 2020-07-05 MED FILL — SERTRALINE HCL 100 MG TABS: 100 | 90 days supply | Qty: 90 | Fill #0

## 2020-07-05 MED FILL — ATORVASTATIN CALCIUM 20 MG: 20 | 90 days supply | Qty: 90 | Fill #0

## 2020-07-05 MED FILL — AZITHROMYCIN 250 MG TABLET: 250 | 5 days supply | Qty: 6 | Fill #0

## 2020-08-10 ENCOUNTER — Other Ambulatory Visit (HOSPITAL_COMMUNITY): Payer: Self-pay

## 2020-08-10 MED ORDER — TRIAMCINOLONE ACETONIDE 0.1 % EX OINT
TOPICAL_OINTMENT | CUTANEOUS | 2 refills | Status: AC
  Filled 2020-08-10 – 2020-08-18 (×2): qty 30, 30d supply, fill #0

## 2020-08-18 ENCOUNTER — Other Ambulatory Visit (HOSPITAL_COMMUNITY): Payer: Self-pay

## 2020-09-07 ENCOUNTER — Other Ambulatory Visit: Payer: Self-pay | Admitting: Internal Medicine

## 2020-09-07 DIAGNOSIS — E7800 Pure hypercholesterolemia, unspecified: Secondary | ICD-10-CM

## 2020-09-11 ENCOUNTER — Other Ambulatory Visit (HOSPITAL_COMMUNITY): Payer: Self-pay

## 2020-09-11 MED ORDER — AZITHROMYCIN 250 MG PO TABS
ORAL_TABLET | ORAL | 0 refills | Status: DC
Start: 2020-09-11 — End: 2021-02-25
  Filled 2020-09-11: qty 6, 5d supply, fill #0

## 2020-10-02 MED FILL — Atorvastatin Calcium Tab 20 MG (Base Equivalent): ORAL | 90 days supply | Qty: 90 | Fill #0 | Status: AC

## 2020-10-02 MED FILL — Sertraline HCl Tab 100 MG: ORAL | 90 days supply | Qty: 90 | Fill #0 | Status: AC

## 2020-10-02 MED FILL — Bupropion HCl Tab ER 24HR 150 MG: ORAL | 90 days supply | Qty: 90 | Fill #0 | Status: AC

## 2020-10-03 ENCOUNTER — Other Ambulatory Visit (HOSPITAL_COMMUNITY): Payer: Self-pay

## 2020-11-28 ENCOUNTER — Other Ambulatory Visit: Payer: No Typology Code available for payment source

## 2020-12-12 ENCOUNTER — Other Ambulatory Visit (HOSPITAL_COMMUNITY): Payer: Self-pay

## 2020-12-12 MED ORDER — AZITHROMYCIN 250 MG PO TABS
ORAL_TABLET | ORAL | 0 refills | Status: DC
Start: 2020-12-12 — End: 2021-02-25
  Filled 2020-12-12: qty 6, 5d supply, fill #0

## 2020-12-28 ENCOUNTER — Other Ambulatory Visit (HOSPITAL_COMMUNITY): Payer: Self-pay

## 2020-12-28 MED ORDER — FLUTICASONE-SALMETEROL 250-50 MCG/ACT IN AEPB
INHALATION_SPRAY | RESPIRATORY_TRACT | 0 refills | Status: AC
  Filled 2020-12-28: qty 60, 30d supply, fill #0

## 2020-12-28 MED ORDER — GUAIFENESIN-DM 100-10 MG/5ML PO SYRP: ORAL_SOLUTION | ORAL | 0 refills | Status: AC

## 2021-01-02 ENCOUNTER — Other Ambulatory Visit (HOSPITAL_COMMUNITY): Payer: Self-pay

## 2021-01-03 ENCOUNTER — Other Ambulatory Visit (HOSPITAL_COMMUNITY): Payer: Self-pay

## 2021-01-03 MED ORDER — BUPROPION HCL ER (XL) 150 MG PO TB24
ORAL_TABLET | ORAL | 3 refills | Status: AC
  Filled 2021-01-03: qty 90, 90d supply, fill #0
  Filled 2021-04-15: qty 90, 90d supply, fill #1

## 2021-01-08 ENCOUNTER — Other Ambulatory Visit (HOSPITAL_COMMUNITY): Payer: Self-pay

## 2021-01-08 MED ORDER — SERTRALINE HCL 100 MG PO TABS
100.0000 mg | ORAL_TABLET | Freq: Every day | ORAL | 1 refills | Status: DC
  Filled 2021-01-08: qty 90, 90d supply, fill #0
  Filled 2021-04-15: qty 90, 90d supply, fill #1

## 2021-01-08 MED ORDER — ATORVASTATIN CALCIUM 20 MG PO TABS
20.0000 mg | ORAL_TABLET | Freq: Every day | ORAL | 1 refills | Status: DC
Start: 2021-01-08 — End: 2021-02-25
  Filled 2021-01-08: qty 90, 90d supply, fill #0

## 2021-01-15 ENCOUNTER — Other Ambulatory Visit (HOSPITAL_COMMUNITY): Payer: Self-pay

## 2021-01-15 MED ORDER — EPINEPHRINE 0.3 MG/0.3ML IJ SOAJ
INTRAMUSCULAR | 3 refills | Status: AC
  Filled 2021-01-15: qty 2, 30d supply, fill #0

## 2021-01-22 ENCOUNTER — Other Ambulatory Visit (HOSPITAL_COMMUNITY): Payer: Self-pay

## 2021-01-22 MED ORDER — DOXYCYCLINE HYCLATE 100 MG PO CAPS
ORAL_CAPSULE | ORAL | 0 refills | Status: DC
Start: 2021-01-22 — End: 2021-02-25
  Filled 2021-01-22: qty 14, 7d supply, fill #0

## 2021-02-25 ENCOUNTER — Emergency Department (HOSPITAL_COMMUNITY): Payer: No Typology Code available for payment source

## 2021-02-25 ENCOUNTER — Emergency Department (HOSPITAL_COMMUNITY)
Admission: EM | Admit: 2021-02-25 | Discharge: 2021-02-25 | Disposition: A | Discharge status: Home / Self Care | Payer: No Typology Code available for payment source | Attending: Emergency Medicine | Admitting: Emergency Medicine

## 2021-02-25 ENCOUNTER — Encounter (HOSPITAL_COMMUNITY): Payer: Self-pay

## 2021-02-25 DIAGNOSIS — K219 Gastro-esophageal reflux disease without esophagitis: Secondary | ICD-10-CM | POA: Insufficient documentation

## 2021-02-25 DIAGNOSIS — R1032 Left lower quadrant pain: Secondary | ICD-10-CM | POA: Diagnosis present

## 2021-02-25 DIAGNOSIS — K429 Umbilical hernia without obstruction or gangrene: Secondary | ICD-10-CM | POA: Diagnosis not present

## 2021-02-25 LAB — COMPREHENSIVE METABOLIC PANEL
ALT: 28 U/L (ref 0–44)
AST: 26 U/L (ref 15–41)
Albumin: 4.3 g/dL (ref 3.5–5.0)
Alkaline Phosphatase: 111 U/L (ref 38–126)
Anion gap: 8 (ref 5–15)
BUN: 17 mg/dL (ref 6–20)
CO2: 25 mmol/L (ref 22–32)
Calcium: 9 mg/dL (ref 8.9–10.3)
Chloride: 106 mmol/L (ref 98–111)
Creatinine, Ser: 1.14 mg/dL (ref 0.61–1.24)
GFR, Estimated: >60 mL/min (ref >60)
Glucose, Bld: 108 mg/dL — ABNORMAL HIGH (ref 70–99)
Potassium: 4.4 mmol/L (ref 3.5–5.1)
Sodium: 139 mmol/L (ref 135–145)
Total Bilirubin: 0.9 mg/dL (ref 0.3–1.2)
Total Protein: 7.5 g/dL (ref 6.5–8.1)

## 2021-02-25 LAB — CBC
HCT: 49.9 % (ref 39.0–52.0)
Hemoglobin: 17.2 g/dL — ABNORMAL HIGH (ref 13.0–17.0)
MCH: 32.1 pg (ref 26.0–34.0)
MCHC: 34.5 g/dL (ref 30.0–36.0)
MCV: 93.1 fL (ref 80.0–100.0)
Platelets: 261 10*3/uL (ref 150–400)
RBC: 5.36 MIL/uL (ref 4.22–5.81)
RDW: 13.1 % (ref 11.5–15.5)
WBC: 8.4 10*3/uL (ref 4.0–10.5)
nRBC: 0 % (ref 0.0–0.2)

## 2021-02-25 LAB — URINALYSIS, ROUTINE W REFLEX MICROSCOPIC
Bilirubin Urine: NEGATIVE
Glucose, UA: NEGATIVE mg/dL
Hgb urine dipstick: NEGATIVE
Ketones, ur: NEGATIVE mg/dL
Leukocytes,Ua: NEGATIVE
Nitrite: NEGATIVE
Protein, ur: NEGATIVE mg/dL
Specific Gravity, Urine: 1.018 (ref 1.005–1.030)
pH: 5 (ref 5.0–8.0)

## 2021-02-25 LAB — LIPASE, BLOOD: Lipase: 27 U/L (ref 11–51)

## 2021-02-25 MED ORDER — IOHEXOL 350 MG/ML SOLN
80.0000 mL | Freq: Once | INTRAVENOUS | Status: AC | PRN
  Administered 2021-02-25: 80 mL via INTRAVENOUS

## 2021-02-25 MED ORDER — ONDANSETRON 4 MG PO TBDP: 4.0000 mg | ORAL_TABLET | Freq: Once | ORAL | Status: DC | PRN

## 2021-02-25 NOTE — Discharge Instructions (Addendum)
You were seen in the emergency department today for abdominal pain.  As we discussed the CT scan confirmed that you do have a hernia, but it just commented on fat tissue, there are no loops of your bowel in the hernia.  This is something that should be corrected with surgery, and I like you to follow-up at your scheduled appointment tomorrow.  In the meantime you can take ibuprofen or Tylenol for pain as needed.  I also recommend sticking to a bland diet, to overall let your bowels rest for the next several days.  Continue to monitor how you're doing and return to the ER for new or worsening symptoms such as fevers, chills, vomiting, inability to move your bowels.   It has been a pleasure seeing and caring for you today and I hope you start feeling better soon!

## 2021-02-25 NOTE — ED Provider Notes (Signed)
[facility] DEPT Provider Note   CSN: [id number] Arrival date & time: 02/25/21  0940     History Chief Complaint  Patient presents with   Abdominal Pain    Juan Gallegos is a 50 y.o. male with history of GERD and sleep apnea who presents to the emergency department complaining of abdominal pain x 4 days.  Patient states that last week he was helping his sister lift heavy boxes.  Since then he has had pain and swelling around his umbilicus, as well as left lower quadrant pain and associated nausea and indigestion.  No aggravating or alleviating factors.  Has not taken anything for pain.  Rates the pain 7/10 and states he cannot find a comfortable position. Denies fevers, chills, chest pain, shortness of breath, vomiting, constipation, diarrhea, dysuria or hematuria.  No history of abdominal hernias, had cholecystectomy in 2021 with no other reported abdominal surgeries.    Abdominal Pain Associated symptoms: nausea   Associated symptoms: no chest pain, no chills, no constipation, no diarrhea, no dysuria, no fever, no hematuria, no shortness of breath and no vomiting       Past Medical History:  Diagnosis Date   Anxiety    GERD (gastroesophageal reflux disease)    Sleep apnea    uses cpap    Patient Active Problem List   Diagnosis Date Noted   OSA (obstructive sleep apnea) 05/01/2018   Obesity (BMI 35.0-39.9 without comorbidity) 05/01/2018    Past Surgical History:  Procedure Laterality Date   BACK SURGERY     BIOPSY  06/04/2018   Procedure: BIOPSY;  Surgeon: Juanita Craver, MD;  Location: [facility];  Service: Endoscopy;;   CHOLECYSTECTOMY N/A 03/06/2020   Procedure: LAPAROSCOPIC CHOLECYSTECTOMY;  Surgeon: Coralie Keens, MD;  Location: [facility];  Service: General;  Laterality: N/A;   COLONOSCOPY WITH PROPOFOL N/A 06/04/2018   Procedure: COLONOSCOPY WITH PROPOFOL;  Surgeon: Juanita Craver, MD;  Location: [facility];   Service: Endoscopy;  Laterality: N/A;   NASAL SEPTUM SURGERY     POLYPECTOMY  06/04/2018   Procedure: POLYPECTOMY;  Surgeon: Juanita Craver, MD;  Location: [facility];  Service: Endoscopy;;       No family history on file.  Social History   Tobacco Use   Smoking status: Never   Smokeless tobacco: Never  Substance Use Topics   Alcohol use: No   Drug use: No    Home Medications Prior to Admission medications   Medication Sig Start Date End Date Taking? Authorizing Provider  atorvastatin (LIPITOR) 20 MG tablet Take 20 mg by mouth daily.  06/20/13  Yes [provider]  buPROPion (WELLBUTRIN XL) 150 MG 24 hr tablet Take 1 tablet by mouth in the morning 01/03/21  Yes   cetirizine (ZYRTEC) 10 MG tablet Take 10 mg by mouth daily as needed for allergies.    Yes [provider]  Cholecalciferol (VITAMIN D) 50 MCG (2000 UT) tablet Take 2,000 Units by mouth daily.   Yes [provider]  Coenzyme Q10 (COQ-10) 200 MG CAPS Take 200 mg by mouth daily.   Yes [provider]  esomeprazole (NEXIUM) 20 MG capsule Take 20 mg by mouth 2 (two) times daily.   Yes [provider]  fluticasone (FLONASE) 50 MCG/ACT nasal spray Place 2 sprays into both nostrils daily as needed for allergies.    Yes [provider]  fluticasone-salmeterol (ADVAIR) 250-50 MCG/ACT AEPB Inhale 1 puff into the lungs twice daily. 12/28/20  Yes  Deland Pretty, MD  Multiple Vitamin (MULTIVITAMIN WITH MINERALS) TABS tablet Take 1 tablet by mouth daily.   Yes [provider]  Omega-3 Fatty Acids (FISH OIL PO) Take 1 capsule by mouth daily.   Yes [provider]  sertraline (ZOLOFT) 100 MG tablet Take 100 mg by mouth every evening.  03/02/18  Yes [provider]  triamcinolone ointment (KENALOG) 0.1 % Apply to the affected area twice a day - not over 2 weeks in same location 08/10/20  Yes   EPINEPHrine 0.3 mg/0.3 mL IJ SOAJ injection Inject as directed for severe  allergic reaction 01/08/21     guaiFENesin-dextromethorphan (ROBITUSSIN DM) 100-10 MG/5ML syrup Take 10 mLs by mouth every 4 hours as needed for 10 days Patient not taking: Reported on 02/25/2021 12/28/20     ondansetron (ZOFRAN) 8 MG tablet TAKE 1 TABLET BY MOUTH EVERY 8 HOURS AS NEEDED Patient not taking: Reported on 02/25/2021 04/13/20 04/13/21  Deon Pilling, NP  oxyCODONE (OXY IR/ROXICODONE) 5 MG immediate release tablet Take 1 tablet (5 mg total) by mouth every 6 (six) hours as needed for moderate pain or severe pain. Patient not taking: Reported on 02/25/2021 03/06/20   Coralie Keens, MD  sertraline (ZOLOFT) 100 MG tablet TAKE 1 TABLET BY MOUTH ONCE DAILY Patient not taking: Reported on 02/25/2021 01/08/21       Allergies    Prednisone  Review of Systems   Review of Systems  Constitutional:  Negative for chills and fever.  Respiratory:  Negative for shortness of breath.   Cardiovascular:  Negative for chest pain.  Gastrointestinal:  Positive for abdominal pain and nausea. Negative for constipation, diarrhea and vomiting.  Genitourinary:  Negative for decreased urine volume, dysuria, flank pain, frequency, hematuria and urgency.  Musculoskeletal:  Negative for back pain.  All other systems reviewed and are negative.  Physical Exam Updated Vital Signs BP (!) 141/86 (BP Location: Right Arm)   Pulse 72   Temp 98.2 F (36.8 C) (Oral)   Resp 18   Ht 5\' 11"  (1.803 m)   Wt 134.3 kg   SpO2 97%   BMI 41.28 kg/m   Physical Exam Vitals and nursing note reviewed.  Constitutional:      Appearance: Normal appearance.  HENT:     Head: Normocephalic and atraumatic.  Eyes:     Conjunctiva/sclera: Conjunctivae normal.  Cardiovascular:     Rate and Rhythm: Normal rate and regular rhythm.  Pulmonary:     Effort: Pulmonary effort is normal. No respiratory distress.     Breath sounds: Normal breath sounds.  Abdominal:     General: There is no distension.     Palpations: Abdomen is  soft.     Tenderness: There is abdominal tenderness in the left lower quadrant. There is no guarding or rebound.     Hernia: A hernia is present. Hernia is present in the umbilical area.  Skin:    General: Skin is warm and dry.  Neurological:     General: No focal deficit present.     Mental Status: He is alert.    ED Results / Procedures / Treatments   Labs (all labs ordered are listed, but only abnormal results are displayed) Labs Reviewed  COMPREHENSIVE METABOLIC PANEL - Abnormal; Notable for the following components:      Result Value   Glucose, Bld 108 (*)    All other components within normal limits  CBC - Abnormal; Notable for the following components:   Hemoglobin 17.2 (*)  All other components within normal limits  URINALYSIS, ROUTINE W REFLEX MICROSCOPIC - Abnormal; Notable for the following components:   APPearance HAZY (*)    All other components within normal limits  LIPASE, BLOOD    EKG None  Radiology CT ABDOMEN PELVIS W CONTRAST  Result Date: 02/25/2021 CLINICAL DATA:  Mid abd pain nausea and distention this week. Patient reports umbilical pain since Wednesday. EXAM: CT ABDOMEN AND PELVIS WITH CONTRAST TECHNIQUE: Multidetector CT imaging of the abdomen and pelvis was performed using the standard protocol following bolus administration of intravenous contrast. CONTRAST:  35mL OMNIPAQUE IOHEXOL 350 MG/ML SOLN COMPARISON:  CT abdomen pelvis 05/07/2017 FINDINGS: Lower chest: No acute abnormality. Hepatobiliary: No focal liver abnormality is seen. Status post cholecystectomy. No biliary dilatation. Pancreas: Unremarkable. No pancreatic ductal dilatation or surrounding inflammatory changes. Spleen: Normal in size without focal abnormality. Adrenals/Urinary Tract: Adrenal glands are unremarkable. Kidneys are normal, without renal calculi, focal lesion, or hydronephrosis. Bladder is unremarkable. Stomach/Bowel: Stomach is within normal limits. Appendix appears normal. No  evidence of bowel wall thickening, distention, or inflammatory changes. Vascular/Lymphatic: No significant vascular findings are present. No enlarged abdominal or pelvic lymph nodes. Reproductive: Prostate is unremarkable. Other: There are two small adjacent fascial defects at the umbilicus consistent with hernias. The right-sided hernia measures 2.7 x 2.1 x 2.2 cm. The left sided hernia measures 5.1 x 2.8 x 3.3 cm. The left defect appear to contain herniated peritoneum and possibly omental fat with edema and trace fluid raising concern for fat incarceration. There are no bowel loops within the hernias. Musculoskeletal: No acute or significant osseous findings. IMPRESSION: There are two adjacent fascial defects at the umbilicus consistent with hernias, measurements as above. The left defect appears to contain herniated peritoneum and possibly omental fat with edema and trace fluid raising concern for fat incarceration. There are no bowel loops within the hernias. Electronically Signed   By: Audie Pinto M.D.   On: 02/25/2021 11:38    Procedures Procedures   Medications Ordered in ED Medications  ondansetron (ZOFRAN-ODT) disintegrating tablet 4 mg (has no administration in time range)  iohexol (OMNIPAQUE) 350 MG/ML injection 80 mL (80 mLs Intravenous Contrast Given 02/25/21 1118)    ED Course  I have reviewed the triage vital signs and the nursing notes.  Pertinent labs & imaging results that were available during my care of the patient were reviewed by me and considered in my medical decision making (see chart for details).    MDM Rules/Calculators/A&P                           Patient is a 50 year old male presents to the emergency department with 4 days of abdominal pain.  Patient states that he was lifting heavy boxes last week, and since has been noticing swelling in his umbilicus with associated left lower quadrant pain and nausea.  No fevers, chills, chest pain, shortness of breath,  vomiting, constipation or diarrhea.  On exam patient is afebrile, not tachycardic, and in no acute distress. There is an appreciable umbilical hernia that is reducible, but slips out again.  Tenderness to palpation over the hernia and left lower quadrant tenderness.  No guarding or rigidity.  Lab work is unremarkable, CT scan showed 2 adjacent fascial defects at the umbilicus consistent with hernias, both fat-containing, there is no bowel loops, concern for fat incarceration.  Otherwise patient is hemodynamically stable, not requiring admission or inpatient treatment for his symptoms at  this time.  Patient would like to first attempt controlling pain with over-the-counter medications.  He already has an appointment scheduled at [facility] surgery tomorrow morning.  Discussed reasons to return to the emergency department.  Patient agreeable plan.  I discussed this case with my attending physician Dr. Regenia Skeeter who cosigned this note including patient's presenting symptoms, physical exam, and planned diagnostics and interventions. Attending physician stated agreement with plan or made changes to plan which were implemented.    Final Clinical Impression(s) / ED Diagnoses Final diagnoses:  Umbilical hernia without obstruction and without gangrene    Rx / DC Orders ED Discharge Orders     None      Portions of this report may have been transcribed using voice recognition software. Every effort was made to ensure accuracy; however, inadvertent computerized transcription errors may be present.    Estill Cotta 02/25/21 1209    Sherwood Gambler, MD 02/26/21 [phone]

## 2021-02-25 NOTE — ED Triage Notes (Signed)
Patient reports umbilical pain since Wednesday. 7/10. Endorses nausea. Denies vomiting, denies chest pain.

## 2021-03-06 ENCOUNTER — Other Ambulatory Visit: Payer: Self-pay | Admitting: Surgery

## 2021-03-12 ENCOUNTER — Other Ambulatory Visit (HOSPITAL_COMMUNITY): Payer: Self-pay

## 2021-03-12 MED ORDER — OXYCODONE HCL 5 MG PO TABS
ORAL_TABLET | ORAL | 0 refills | Status: AC
  Filled 2021-03-12: qty 25, 6d supply, fill #0

## 2021-04-16 ENCOUNTER — Other Ambulatory Visit (HOSPITAL_COMMUNITY): Payer: Self-pay

## 2021-04-24 ENCOUNTER — Other Ambulatory Visit (HOSPITAL_COMMUNITY): Payer: Self-pay

## 2021-04-24 MED ORDER — ATORVASTATIN CALCIUM 20 MG PO TABS
20.0000 mg | ORAL_TABLET | Freq: Every day | ORAL | 1 refills | Status: DC
  Filled 2021-04-24 – 2021-05-04 (×2): qty 90, 90d supply, fill #0
  Filled 2021-08-12: qty 90, 90d supply, fill #1

## 2021-05-02 ENCOUNTER — Other Ambulatory Visit (HOSPITAL_COMMUNITY): Payer: Self-pay

## 2021-05-04 ENCOUNTER — Other Ambulatory Visit (HOSPITAL_COMMUNITY): Payer: Self-pay

## 2021-06-20 ENCOUNTER — Other Ambulatory Visit (HOSPITAL_COMMUNITY): Payer: Self-pay

## 2021-06-20 MED ORDER — PSEUDOEPH-BROMPHEN-DM 30-2-10 MG/5ML PO SYRP
ORAL_SOLUTION | ORAL | 0 refills | Status: DC
  Filled 2021-06-20: qty 200, 13d supply, fill #0

## 2021-06-20 MED ORDER — CARESTART COVID-19 HOME TEST VI KIT
PACK | 0 refills | Status: AC
  Filled 2021-06-20: qty 4, 4d supply, fill #0

## 2021-06-29 ENCOUNTER — Ambulatory Visit
Admission: RE | Admit: 2021-06-29 | Discharge: 2021-06-29 | Disposition: A | Discharge status: Home / Self Care | Payer: No Typology Code available for payment source | Source: Ambulatory Visit | Attending: Internal Medicine | Admitting: Internal Medicine

## 2021-06-29 DIAGNOSIS — E7800 Pure hypercholesterolemia, unspecified: Secondary | ICD-10-CM

## 2021-07-02 ENCOUNTER — Other Ambulatory Visit (HOSPITAL_COMMUNITY): Payer: Self-pay

## 2021-07-02 MED ORDER — BUPROPION HCL ER (XL) 300 MG PO TB24
ORAL_TABLET | ORAL | 3 refills | Status: DC
  Filled 2021-07-02: qty 90, 90d supply, fill #0
  Filled 2021-10-29: qty 90, 90d supply, fill #1
  Filled 2022-01-24: qty 90, 90d supply, fill #2
  Filled 2022-04-28: qty 90, 90d supply, fill #3

## 2021-07-03 ENCOUNTER — Other Ambulatory Visit (HOSPITAL_COMMUNITY): Payer: Self-pay

## 2021-08-12 ENCOUNTER — Other Ambulatory Visit (HOSPITAL_COMMUNITY): Payer: Self-pay

## 2021-08-13 ENCOUNTER — Other Ambulatory Visit (HOSPITAL_COMMUNITY): Payer: Self-pay

## 2021-08-13 MED ORDER — SERTRALINE HCL 100 MG PO TABS
100.0000 mg | ORAL_TABLET | Freq: Every day | ORAL | 1 refills | Status: DC
  Filled 2021-08-13: qty 90, 90d supply, fill #0
  Filled 2021-11-11: qty 90, 90d supply, fill #1

## 2021-10-29 ENCOUNTER — Other Ambulatory Visit (HOSPITAL_COMMUNITY): Payer: Self-pay

## 2021-11-11 ENCOUNTER — Other Ambulatory Visit (HOSPITAL_COMMUNITY): Payer: Self-pay

## 2021-11-12 ENCOUNTER — Other Ambulatory Visit (HOSPITAL_COMMUNITY): Payer: Self-pay

## 2021-11-12 MED ORDER — ATORVASTATIN CALCIUM 20 MG PO TABS
20.0000 mg | ORAL_TABLET | Freq: Every day | ORAL | 3 refills | Status: DC
  Filled 2021-11-12: qty 90, 90d supply, fill #0
  Filled 2022-02-21: qty 90, 90d supply, fill #1
  Filled 2022-05-24: qty 90, 90d supply, fill #2
  Filled 2022-08-29 – 2022-09-10 (×3): qty 90, 90d supply, fill #3

## 2022-01-21 ENCOUNTER — Other Ambulatory Visit (HOSPITAL_COMMUNITY): Payer: Self-pay

## 2022-01-21 MED ORDER — AZITHROMYCIN 250 MG PO TABS
ORAL_TABLET | ORAL | 0 refills | Status: DC
  Filled 2022-01-21: qty 6, 5d supply, fill #0

## 2022-01-21 MED ORDER — PREDNISONE 10 MG PO TABS
ORAL_TABLET | ORAL | 0 refills | Status: DC
  Filled 2022-01-21: qty 27, 10d supply, fill #0

## 2022-01-24 ENCOUNTER — Other Ambulatory Visit (HOSPITAL_COMMUNITY): Payer: Self-pay

## 2022-02-22 ENCOUNTER — Other Ambulatory Visit (HOSPITAL_COMMUNITY): Payer: Self-pay

## 2022-02-24 ENCOUNTER — Other Ambulatory Visit (HOSPITAL_COMMUNITY): Payer: Self-pay

## 2022-02-25 ENCOUNTER — Other Ambulatory Visit (HOSPITAL_COMMUNITY): Payer: Self-pay

## 2022-02-25 MED ORDER — SERTRALINE HCL 100 MG PO TABS
100.0000 mg | ORAL_TABLET | Freq: Every day | ORAL | 3 refills | Status: DC
  Filled 2022-02-25: qty 90, 90d supply, fill #0
  Filled 2022-06-23: qty 90, 90d supply, fill #1
  Filled 2022-10-03 (×2): qty 90, 90d supply, fill #2
  Filled 2022-12-29: qty 90, 90d supply, fill #3

## 2022-03-30 ENCOUNTER — Telehealth: Payer: No Typology Code available for payment source | Admitting: Urgent Care

## 2022-03-30 DIAGNOSIS — R051 Acute cough: Secondary | ICD-10-CM

## 2022-03-30 DIAGNOSIS — J0101 Acute recurrent maxillary sinusitis: Secondary | ICD-10-CM

## 2022-03-30 MED ORDER — AMOXICILLIN-POT CLAVULANATE 875-125 MG PO TABS: 1.0000 | ORAL_TABLET | Freq: Two times a day (BID) | ORAL | 0 refills | Status: AC

## 2022-03-30 MED ORDER — PREDNISONE 10 MG PO TABS: ORAL_TABLET | ORAL | 0 refills | Status: AC

## 2022-03-30 MED ORDER — PSEUDOEPH-BROMPHEN-DM 30-2-10 MG/5ML PO SYRP: 5.0000 mL | ORAL_SOLUTION | Freq: Four times a day (QID) | ORAL | 0 refills | Status: AC | PRN

## 2022-03-30 NOTE — Patient Instructions (Signed)
Juan Gallegos, thank you for joining Chaney Malling, PA for today's virtual visit.  While this provider is not your primary care provider (PCP), if your PCP is located in our provider database this encounter information will be shared with them immediately following your visit.   [place] account gives you access to today's visit and all your visits, tests, and labs performed at [facility] " click here if you don't have a [place] account or go to [email or web address]  Consent: (Patient) Juan Gallegos provided verbal consent for this virtual visit at the beginning of the encounter.  Current Medications:  Current Outpatient Medications:    amoxicillin-clavulanate (AUGMENTIN) 875-125 MG tablet, Take 1 tablet by mouth 2 (two) times daily with a meal for 10 days., Disp: 20 tablet, Rfl: 0   brompheniramine-pseudoephedrine-DM 30-2-10 MG/5ML syrup, Take 5 mLs by mouth 4 (four) times daily as needed., Disp: 120 mL, Rfl: 0   atorvastatin (LIPITOR) 20 MG tablet, Take 20 mg by mouth daily. , Disp: , Rfl:    atorvastatin (LIPITOR) 20 MG tablet, TAKE 1 TABLET BY MOUTH ONCE DAILY, Disp: 90 tablet, Rfl: 3   buPROPion (WELLBUTRIN XL) 150 MG 24 hr tablet, Take 1 tablet by mouth in the morning, Disp: 90 tablet, Rfl: 3   buPROPion (WELLBUTRIN XL) 300 MG 24 hr tablet, TAKE 1 TABLET DAILY IN THE MORNING., Disp: 90 tablet, Rfl: 3   cetirizine (ZYRTEC) 10 MG tablet, Take 10 mg by mouth daily as needed for allergies. , Disp: , Rfl:    Cholecalciferol (VITAMIN D) 50 MCG (2000 UT) tablet, Take 2,000 Units by mouth daily., Disp: , Rfl:    Coenzyme Q10 (COQ-10) 200 MG CAPS, Take 200 mg by mouth daily., Disp: , Rfl:    COVID-19 At Home Antigen Test (CARESTART COVID-19 HOME TEST) KIT, Use as directed, Disp: 4 each, Rfl: 0   EPINEPHrine 0.3 mg/0.3 mL IJ SOAJ injection, Inject as directed for severe allergic reaction, Disp: 2 each, Rfl: 3   esomeprazole (NEXIUM) 20 MG capsule, Take  20 mg by mouth 2 (two) times daily., Disp: , Rfl:    fluticasone (FLONASE) 50 MCG/ACT nasal spray, Place 2 sprays into both nostrils daily as needed for allergies. , Disp: , Rfl:    fluticasone-salmeterol (ADVAIR) 250-50 MCG/ACT AEPB, Inhale 1 puff into the lungs twice daily., Disp: 60 each, Rfl: 0   guaiFENesin-dextromethorphan (ROBITUSSIN DM) 100-10 MG/5ML syrup, Take 10 mLs by mouth every 4 hours as needed for 10 days (Patient not taking: Reported on 02/25/2021), Disp: 600 mL, Rfl: 0   Multiple Vitamin (MULTIVITAMIN WITH MINERALS) TABS tablet, Take 1 tablet by mouth daily., Disp: , Rfl:    Omega-3 Fatty Acids (FISH OIL PO), Take 1 capsule by mouth daily., Disp: , Rfl:    oxyCODONE (OXY IR/ROXICODONE) 5 MG immediate release tablet, Take 1 tablet (5 mg total) by mouth every 6 (six) hours as needed for moderate pain or severe pain. (Patient not taking: Reported on 02/25/2021), Disp: 25 tablet, Rfl: 0   oxyCODONE (OXY IR/ROXICODONE) 5 MG immediate release tablet, Take 1 tablet (5 mg total) by mouth every 6 (six) hours as needed for pain, Disp: 25 tablet, Rfl: 0   predniSONE (DELTASONE) 10 MG tablet, Take 6 tabs daily x 1 day, 5 tabs daily x 1 day, 4 tabs daily x 1 day, 3 tablets daily x 1 day, 2 tabs daily x 2 days,1 and 1/2 tabs daily x 2 days, 1 tab daily  x 2 days (Take with food or milk), Disp: 27 tablet, Rfl: 0   sertraline (ZOLOFT) 100 MG tablet, Take 100 mg by mouth every evening. , Disp: , Rfl:    sertraline (ZOLOFT) 100 MG tablet, Take 1 tablet (100 mg total) by mouth daily., Disp: 90 tablet, Rfl: 3   triamcinolone ointment (KENALOG) 0.1 %, Apply to the affected area twice a day - not over 2 weeks in same location, Disp: 30 g, Rfl: 2   Medications ordered in this encounter:  Meds ordered this encounter  Medications   amoxicillin-clavulanate (AUGMENTIN) 875-125 MG tablet    Sig: Take 1 tablet by mouth 2 (two) times daily with a meal for 10 days.    Dispense:  20 tablet    Refill:  0     Order Specific Question:   Supervising Provider    Answer:   Chase Picket [id number]   brompheniramine-pseudoephedrine-DM 30-2-10 MG/5ML syrup    Sig: Take 5 mLs by mouth 4 (four) times daily as needed.    Dispense:  120 mL    Refill:  0    Order Specific Question:   Supervising Provider    Answer:   Chase Picket [[id number]]   predniSONE (DELTASONE) 10 MG tablet    Sig: Take 6 tabs daily x 1 day, 5 tabs daily x 1 day, 4 tabs daily x 1 day, 3 tablets daily x 1 day, 2 tabs daily x 2 days,1 and 1/2 tabs daily x 2 days, 1 tab daily x 2 days (Take with food or milk)    Dispense:  27 tablet    Refill:  0    Order Specific Question:   Supervising Provider    Answer:   Chase Picket [id number]     *If you need refills on other medications prior to your next appointment, please contact your pharmacy*  Follow-Up: Call back or seek an in-person evaluation if the symptoms worsen or if the condition fails to improve as anticipated.  [place] [phone]  Other Instructions You have a sinus/ lower respiratory tract infection. Please start taking the antibiotic, Augmentin, twice daily with food. Take it for all 10 days, do not stop early just because you feel better. Take an over the counter probiotic or yogurt daily to help prevent diarrhea/ yeast infection.  Continue taking zyrtec once daily to help clear up the mucous. Continue Flonase daily to help with inflammation of the nasal passage.  You can start taking the prednisone tomorrow. Best to take in the morning with breakfast to prevent insomnia at night. This is the same dosage you took in October from your PCP.  It is also recommended that you use nasal saline/ sinus washes to cleans the sinus passages. Hot steam from a shower or vaporizer may also be beneficial to help open up the upper airway. Eucalyptus can be helpful. If any worsening symptoms such as headache, fever, or shortness of breath, please go to an  in person evaluation/ urgent care.    If you have been instructed to have an in-person evaluation today at a local Urgent Care facility, please use the link below. It will take you to a list of all of our available [facility], including address, phone number and hours of operation. Please do not delay care.  [facility]  If you or a family member do not have a primary care provider, use the link below to schedule a visit  and establish care. When you choose a [facility] primary care physician or advanced practice provider, you gain a long-term partner in health. Find a Primary Care Provider  Learn more about [facility]'s in-office and virtual care options: [facility]

## 2022-03-30 NOTE — Progress Notes (Signed)
Virtual Visit Consent   [name], you are scheduled for a virtual visit with a [name] provider today. Just as with appointments in the office, your consent must be obtained to participate. Your consent will be active for this visit and any virtual visit you may have with one of our providers in the next 365 days. If you have a MyChart account, a copy of this consent can be sent to you electronically.  As this is a virtual visit, video technology does not allow for your provider to perform a traditional examination. This may limit your provider's ability to fully assess your condition. If your provider identifies any concerns that need to be evaluated in person or the need to arrange testing (such as labs, EKG, etc.), we will make arrangements to do so. Although advances in technology are sophisticated, we cannot ensure that it will always work on either your end or our end. If the connection with a video visit is poor, the visit may have to be switched to a telephone visit. With either a video or telephone visit, we are not always able to ensure that we have a secure connection.  By engaging in this virtual visit, you consent to the provision of healthcare and authorize for your insurance to be billed (if applicable) for the services provided during this visit. Depending on your insurance coverage, you may receive a charge related to this service.  I need to obtain your verbal consent now. Are you willing to proceed with your visit today? Juan Gallegos has provided verbal consent on 03/30/2022 for a virtual visit (video or telephone). Chaney Malling, PA  Date: 03/30/2022 5:06 PM  Virtual Visit via Video Note   I, Juan Gallegos, connected with  Juan Gallegos  ([id number], 12/06/1970) on 03/30/22 at  5:00 PM EST by a video-enabled telemedicine application and verified that I am speaking with the correct person using two identifiers.  Location: Patient: Virtual Visit Location Patient:  Home Provider: Virtual Visit Location Provider: Home Office   I discussed the limitations of evaluation and management by telemedicine and the availability of in person appointments. The patient expressed understanding and agreed to proceed.    History of Present Illness: Juan Gallegos is a 51 y.o. who identifies as a male who was assigned male at birth, and is being seen today for sinus pain and fever.  HPI: 51yo male with known hx of allergies and recurrent sinus infections presents today due to worsening bilateral maxillary sinus pain over the past 1.5 weeks. States it started out as nasal congestion and has progressed to feel like severe pressure with pain in teeth. Had a fever of 101 last evening. Now he also has developed a deep cough with congestion in chest. He takes zyrtec, advair and flonase routinely. Has been trying mucinex and robitussin since sx onset without improvement in symptoms. Denies SOB/CP/palps. Prednisone listed in chart as allergy, however pt states he was prescribed this in October by his PCP and tolerated it well with no issues. Felt the way it was dosed helped significantly with his sinus issues then and is requesting to try it again.   Problems:  Patient Active Problem List   Diagnosis Date Noted   OSA (obstructive sleep apnea) 05/01/2018   Obesity (BMI 35.0-39.9 without comorbidity) 05/01/2018    Allergies:  Allergies  Allergen Reactions   Prednisone     Hot flashes   Medications:  Current Outpatient Medications:  amoxicillin-clavulanate (AUGMENTIN) 875-125 MG tablet, Take 1 tablet by mouth 2 (two) times daily with a meal for 10 days., Disp: 20 tablet, Rfl: 0   brompheniramine-pseudoephedrine-DM 30-2-10 MG/5ML syrup, Take 5 mLs by mouth 4 (four) times daily as needed., Disp: 120 mL, Rfl: 0   atorvastatin (LIPITOR) 20 MG tablet, Take 20 mg by mouth daily. , Disp: , Rfl:    atorvastatin (LIPITOR) 20 MG tablet, TAKE 1 TABLET BY MOUTH ONCE DAILY, Disp: 90  tablet, Rfl: 3   buPROPion (WELLBUTRIN XL) 150 MG 24 hr tablet, Take 1 tablet by mouth in the morning, Disp: 90 tablet, Rfl: 3   buPROPion (WELLBUTRIN XL) 300 MG 24 hr tablet, TAKE 1 TABLET DAILY IN THE MORNING., Disp: 90 tablet, Rfl: 3   cetirizine (ZYRTEC) 10 MG tablet, Take 10 mg by mouth daily as needed for allergies. , Disp: , Rfl:    Cholecalciferol (VITAMIN D) 50 MCG (2000 UT) tablet, Take 2,000 Units by mouth daily., Disp: , Rfl:    Coenzyme Q10 (COQ-10) 200 MG CAPS, Take 200 mg by mouth daily., Disp: , Rfl:    COVID-19 At Home Antigen Test (CARESTART COVID-19 HOME TEST) KIT, Use as directed, Disp: 4 each, Rfl: 0   EPINEPHrine 0.3 mg/0.3 mL IJ SOAJ injection, Inject as directed for severe allergic reaction, Disp: 2 each, Rfl: 3   esomeprazole (NEXIUM) 20 MG capsule, Take 20 mg by mouth 2 (two) times daily., Disp: , Rfl:    fluticasone (FLONASE) 50 MCG/ACT nasal spray, Place 2 sprays into both nostrils daily as needed for allergies. , Disp: , Rfl:    fluticasone-salmeterol (ADVAIR) 250-50 MCG/ACT AEPB, Inhale 1 puff into the lungs twice daily., Disp: 60 each, Rfl: 0   guaiFENesin-dextromethorphan (ROBITUSSIN DM) 100-10 MG/5ML syrup, Take 10 mLs by mouth every 4 hours as needed for 10 days (Patient not taking: Reported on 02/25/2021), Disp: 600 mL, Rfl: 0   Multiple Vitamin (MULTIVITAMIN WITH MINERALS) TABS tablet, Take 1 tablet by mouth daily., Disp: , Rfl:    Omega-3 Fatty Acids (FISH OIL PO), Take 1 capsule by mouth daily., Disp: , Rfl:    oxyCODONE (OXY IR/ROXICODONE) 5 MG immediate release tablet, Take 1 tablet (5 mg total) by mouth every 6 (six) hours as needed for moderate pain or severe pain. (Patient not taking: Reported on 02/25/2021), Disp: 25 tablet, Rfl: 0   oxyCODONE (OXY IR/ROXICODONE) 5 MG immediate release tablet, Take 1 tablet (5 mg total) by mouth every 6 (six) hours as needed for pain, Disp: 25 tablet, Rfl: 0   predniSONE (DELTASONE) 10 MG tablet, Take 6 tabs daily x 1 day,  5 tabs daily x 1 day, 4 tabs daily x 1 day, 3 tablets daily x 1 day, 2 tabs daily x 2 days,1 and 1/2 tabs daily x 2 days, 1 tab daily x 2 days (Take with food or milk), Disp: 27 tablet, Rfl: 0   sertraline (ZOLOFT) 100 MG tablet, Take 100 mg by mouth every evening. , Disp: , Rfl:    sertraline (ZOLOFT) 100 MG tablet, Take 1 tablet (100 mg total) by mouth daily., Disp: 90 tablet, Rfl: 3   triamcinolone ointment (KENALOG) 0.1 %, Apply to the affected area twice a day - not over 2 weeks in same location, Disp: 30 g, Rfl: 2  Observations/Objective: Patient is well-developed, well-nourished in no acute distress.  Resting in bed at home, NAD, non-toxic but acutely ill.  Head is normocephalic, atraumatic.  No labored breathing. Dry cough heard intermittently.  Speech is Gallegos and coherent with logical content.  Patient is alert and oriented at baseline.  Nose erythematous with noted drainage Very congested voice quality.  Tenderness to personal palpation of maxillary sinuses noted  Assessment and Plan: 1. Acute recurrent maxillary sinusitis  2. Acute cough  Pt with hx of recurrent sinusitis. Already taking prophylactic measures. Feels this is the same. Requesting repeat pred dose pack as this worked well in October, does not have true allergy. Will also add augmentin for suspected bacterial cause. Stop OTC meds, add Bromfed for cough.  Follow Up Instructions: I discussed the assessment and treatment plan with the patient. The patient was provided an opportunity to ask questions and all were answered. The patient agreed with the plan and demonstrated an understanding of the instructions.  A copy of instructions were sent to the patient via MyChart unless otherwise noted below.    The patient was advised to call back or seek an in-person evaluation if the symptoms worsen or if the condition fails to improve as anticipated.  Time:  I spent 8 minutes with the patient via telehealth technology  discussing the above problems/concerns.    [place], PA

## 2022-04-09 ENCOUNTER — Other Ambulatory Visit (HOSPITAL_COMMUNITY): Payer: Self-pay

## 2022-04-09 DIAGNOSIS — J45901 Unspecified asthma with (acute) exacerbation: Secondary | ICD-10-CM | POA: Diagnosis not present

## 2022-04-09 DIAGNOSIS — R5383 Other fatigue: Secondary | ICD-10-CM | POA: Diagnosis not present

## 2022-04-09 DIAGNOSIS — R051 Acute cough: Secondary | ICD-10-CM | POA: Diagnosis not present

## 2022-04-09 MED ORDER — ALBUTEROL SULFATE HFA 108 (90 BASE) MCG/ACT IN AERS
INHALATION_SPRAY | RESPIRATORY_TRACT | 1 refills | Status: AC
  Filled 2022-04-09: qty 6.7, 20d supply, fill #0

## 2022-04-09 MED ORDER — GUAIFENESIN-CODEINE 100-10 MG/5ML PO SOLN
ORAL | 0 refills | Status: AC
  Filled 2022-04-09: qty 140, 7d supply, fill #0

## 2022-04-09 MED ORDER — FLUTICASONE-SALMETEROL 250-50 MCG/ACT IN AEPB
INHALATION_SPRAY | RESPIRATORY_TRACT | 1 refills | Status: DC
  Filled 2022-04-09: qty 60, 30d supply, fill #0
  Filled 2022-11-11: qty 60, 30d supply, fill #1

## 2022-04-10 ENCOUNTER — Other Ambulatory Visit (HOSPITAL_COMMUNITY): Payer: Self-pay

## 2022-04-11 ENCOUNTER — Other Ambulatory Visit: Payer: Self-pay

## 2022-04-11 ENCOUNTER — Other Ambulatory Visit (HOSPITAL_COMMUNITY): Payer: Self-pay

## 2022-04-15 DIAGNOSIS — A0101 Typhoid meningitis: Secondary | ICD-10-CM | POA: Diagnosis not present

## 2022-04-15 DIAGNOSIS — F4323 Adjustment disorder with mixed anxiety and depressed mood: Secondary | ICD-10-CM | POA: Diagnosis not present

## 2022-04-20 ENCOUNTER — Other Ambulatory Visit (HOSPITAL_COMMUNITY): Payer: Self-pay

## 2022-04-20 MED ORDER — AZITHROMYCIN 250 MG PO TABS
ORAL_TABLET | ORAL | 0 refills | Status: AC
  Filled 2022-04-20: qty 6, 5d supply, fill #0

## 2022-04-20 MED ORDER — LAGEVRIO 200 MG PO CAPS
4.0000 | ORAL_CAPSULE | Freq: Two times a day (BID) | ORAL | 0 refills | Status: DC
  Filled 2022-04-20: qty 40, 5d supply, fill #0

## 2022-05-06 ENCOUNTER — Other Ambulatory Visit (HOSPITAL_COMMUNITY): Payer: Self-pay

## 2022-05-15 DIAGNOSIS — F4323 Adjustment disorder with mixed anxiety and depressed mood: Secondary | ICD-10-CM | POA: Diagnosis not present

## 2022-05-15 DIAGNOSIS — A0101 Typhoid meningitis: Secondary | ICD-10-CM | POA: Diagnosis not present

## 2022-05-28 ENCOUNTER — Other Ambulatory Visit (HOSPITAL_COMMUNITY): Payer: Self-pay

## 2022-06-20 DIAGNOSIS — H52203 Unspecified astigmatism, bilateral: Secondary | ICD-10-CM | POA: Diagnosis not present

## 2022-06-25 ENCOUNTER — Other Ambulatory Visit: Payer: Self-pay

## 2022-06-27 DIAGNOSIS — F4323 Adjustment disorder with mixed anxiety and depressed mood: Secondary | ICD-10-CM | POA: Diagnosis not present

## 2022-06-27 DIAGNOSIS — A0101 Typhoid meningitis: Secondary | ICD-10-CM | POA: Diagnosis not present

## 2022-07-01 ENCOUNTER — Other Ambulatory Visit (HOSPITAL_COMMUNITY): Payer: Self-pay

## 2022-07-24 DIAGNOSIS — F4323 Adjustment disorder with mixed anxiety and depressed mood: Secondary | ICD-10-CM | POA: Diagnosis not present

## 2022-07-24 DIAGNOSIS — A0101 Typhoid meningitis: Secondary | ICD-10-CM | POA: Diagnosis not present

## 2022-07-26 ENCOUNTER — Other Ambulatory Visit (HOSPITAL_COMMUNITY): Payer: Self-pay

## 2022-07-26 MED ORDER — AZITHROMYCIN 250 MG PO TABS
ORAL_TABLET | ORAL | 0 refills | Status: AC
  Filled 2022-07-26: qty 6, 5d supply, fill #0

## 2022-07-29 ENCOUNTER — Other Ambulatory Visit (HOSPITAL_COMMUNITY): Payer: Self-pay

## 2022-07-29 MED ORDER — BUPROPION HCL ER (XL) 300 MG PO TB24
300.0000 mg | ORAL_TABLET | Freq: Every morning | ORAL | 3 refills | Status: DC
  Filled 2022-07-29: qty 90, 90d supply, fill #0
  Filled 2022-11-04: qty 90, 90d supply, fill #1
  Filled 2023-02-12: qty 90, 90d supply, fill #2
  Filled 2023-05-19: qty 90, 90d supply, fill #3

## 2022-08-02 ENCOUNTER — Other Ambulatory Visit (HOSPITAL_COMMUNITY): Payer: Self-pay

## 2022-08-19 DIAGNOSIS — Z0000 Encounter for general adult medical examination without abnormal findings: Secondary | ICD-10-CM | POA: Diagnosis not present

## 2022-08-19 DIAGNOSIS — R7303 Prediabetes: Secondary | ICD-10-CM | POA: Diagnosis not present

## 2022-08-22 ENCOUNTER — Other Ambulatory Visit (HOSPITAL_COMMUNITY): Payer: Self-pay

## 2022-08-22 DIAGNOSIS — B370 Candidal stomatitis: Secondary | ICD-10-CM | POA: Diagnosis not present

## 2022-08-22 DIAGNOSIS — R7989 Other specified abnormal findings of blood chemistry: Secondary | ICD-10-CM | POA: Diagnosis not present

## 2022-08-22 DIAGNOSIS — R1011 Right upper quadrant pain: Secondary | ICD-10-CM | POA: Diagnosis not present

## 2022-08-22 DIAGNOSIS — R748 Abnormal levels of other serum enzymes: Secondary | ICD-10-CM | POA: Diagnosis not present

## 2022-08-22 DIAGNOSIS — Z0000 Encounter for general adult medical examination without abnormal findings: Secondary | ICD-10-CM | POA: Diagnosis not present

## 2022-08-22 MED ORDER — FLUCONAZOLE 100 MG PO TABS
100.0000 mg | ORAL_TABLET | Freq: Every day | ORAL | 0 refills | Status: AC
  Filled 2022-08-22: qty 3, 3d supply, fill #0

## 2022-08-26 DIAGNOSIS — A0101 Typhoid meningitis: Secondary | ICD-10-CM | POA: Diagnosis not present

## 2022-08-26 DIAGNOSIS — F4323 Adjustment disorder with mixed anxiety and depressed mood: Secondary | ICD-10-CM | POA: Diagnosis not present

## 2022-09-06 ENCOUNTER — Other Ambulatory Visit (HOSPITAL_COMMUNITY): Payer: Self-pay

## 2022-09-10 ENCOUNTER — Other Ambulatory Visit (HOSPITAL_COMMUNITY): Payer: Self-pay

## 2022-09-23 DIAGNOSIS — A0101 Typhoid meningitis: Secondary | ICD-10-CM | POA: Diagnosis not present

## 2022-09-23 DIAGNOSIS — F4323 Adjustment disorder with mixed anxiety and depressed mood: Secondary | ICD-10-CM | POA: Diagnosis not present

## 2022-10-03 ENCOUNTER — Other Ambulatory Visit (HOSPITAL_BASED_OUTPATIENT_CLINIC_OR_DEPARTMENT_OTHER): Payer: Self-pay

## 2022-10-03 ENCOUNTER — Other Ambulatory Visit (HOSPITAL_COMMUNITY): Payer: Self-pay

## 2022-10-14 DIAGNOSIS — A0101 Typhoid meningitis: Secondary | ICD-10-CM | POA: Diagnosis not present

## 2022-10-14 DIAGNOSIS — F4323 Adjustment disorder with mixed anxiety and depressed mood: Secondary | ICD-10-CM | POA: Diagnosis not present

## 2022-10-21 ENCOUNTER — Ambulatory Visit: Payer: Self-pay | Admitting: Allergy

## 2022-11-08 ENCOUNTER — Other Ambulatory Visit (HOSPITAL_COMMUNITY): Payer: Self-pay

## 2022-11-12 ENCOUNTER — Other Ambulatory Visit (HOSPITAL_COMMUNITY): Payer: Self-pay

## 2022-11-12 DIAGNOSIS — U071 COVID-19: Secondary | ICD-10-CM | POA: Diagnosis not present

## 2022-12-04 ENCOUNTER — Other Ambulatory Visit (HOSPITAL_COMMUNITY): Payer: Self-pay

## 2022-12-05 ENCOUNTER — Other Ambulatory Visit (HOSPITAL_COMMUNITY): Payer: Self-pay

## 2022-12-05 MED ORDER — ATORVASTATIN CALCIUM 20 MG PO TABS
20.0000 mg | ORAL_TABLET | Freq: Every day | ORAL | 3 refills | Status: AC
  Filled 2022-12-05: qty 90, 90d supply, fill #0
  Filled 2023-03-02: qty 90, 90d supply, fill #1
  Filled 2023-06-03: qty 90, 90d supply, fill #2
  Filled 2023-09-01: qty 30, 30d supply, fill #3
  Filled 2023-10-06: qty 30, 30d supply, fill #4
  Filled 2023-11-06: qty 30, 30d supply, fill #5

## 2022-12-12 ENCOUNTER — Other Ambulatory Visit (HOSPITAL_COMMUNITY): Payer: Self-pay

## 2023-01-01 ENCOUNTER — Other Ambulatory Visit (HOSPITAL_COMMUNITY): Payer: Self-pay

## 2023-01-01 DIAGNOSIS — Z23 Encounter for immunization: Secondary | ICD-10-CM | POA: Diagnosis not present

## 2023-01-01 DIAGNOSIS — A0101 Typhoid meningitis: Secondary | ICD-10-CM | POA: Diagnosis not present

## 2023-01-01 DIAGNOSIS — F4323 Adjustment disorder with mixed anxiety and depressed mood: Secondary | ICD-10-CM | POA: Diagnosis not present

## 2023-01-08 ENCOUNTER — Other Ambulatory Visit (HOSPITAL_COMMUNITY): Payer: Self-pay

## 2023-01-08 DIAGNOSIS — R062 Wheezing: Secondary | ICD-10-CM | POA: Diagnosis not present

## 2023-01-08 DIAGNOSIS — J300 Vasomotor rhinitis: Secondary | ICD-10-CM | POA: Diagnosis not present

## 2023-01-08 MED ORDER — MONTELUKAST SODIUM 10 MG PO TABS
10.0000 mg | ORAL_TABLET | Freq: Every day | ORAL | 5 refills | Status: DC
  Filled 2023-01-08: qty 30, 30d supply, fill #0
  Filled 2023-06-23: qty 30, 30d supply, fill #1
  Filled 2023-07-27: qty 30, 30d supply, fill #2
  Filled 2023-08-24: qty 30, 30d supply, fill #3

## 2023-01-08 MED ORDER — AZELASTINE-FLUTICASONE 137-50 MCG/ACT NA SUSP
1.0000 | Freq: Two times a day (BID) | NASAL | 5 refills | Status: AC
  Filled 2023-01-08: qty 23, 30d supply, fill #0

## 2023-01-09 ENCOUNTER — Other Ambulatory Visit (HOSPITAL_COMMUNITY): Payer: Self-pay

## 2023-01-09 MED ORDER — FLUTICASONE PROPIONATE 50 MCG/ACT NA SUSP
1.0000 | Freq: Every day | NASAL | 5 refills | Status: AC
  Filled 2023-01-09: qty 16, 60d supply, fill #0

## 2023-01-09 MED ORDER — AZELASTINE HCL 137 MCG/SPRAY NA SOLN
1.0000 | Freq: Two times a day (BID) | NASAL | 5 refills | Status: AC
  Filled 2023-01-09: qty 30, 30d supply, fill #0

## 2023-01-10 ENCOUNTER — Other Ambulatory Visit (HOSPITAL_COMMUNITY): Payer: Self-pay

## 2023-01-17 ENCOUNTER — Other Ambulatory Visit (HOSPITAL_COMMUNITY): Payer: Self-pay

## 2023-01-28 DIAGNOSIS — A0101 Typhoid meningitis: Secondary | ICD-10-CM | POA: Diagnosis not present

## 2023-01-28 DIAGNOSIS — F4323 Adjustment disorder with mixed anxiety and depressed mood: Secondary | ICD-10-CM | POA: Diagnosis not present

## 2023-02-14 ENCOUNTER — Other Ambulatory Visit (HOSPITAL_COMMUNITY): Payer: Self-pay

## 2023-02-28 DIAGNOSIS — F4323 Adjustment disorder with mixed anxiety and depressed mood: Secondary | ICD-10-CM | POA: Diagnosis not present

## 2023-02-28 DIAGNOSIS — A0101 Typhoid meningitis: Secondary | ICD-10-CM | POA: Diagnosis not present

## 2023-03-04 ENCOUNTER — Other Ambulatory Visit (HOSPITAL_COMMUNITY): Payer: Self-pay

## 2023-03-25 DIAGNOSIS — F4323 Adjustment disorder with mixed anxiety and depressed mood: Secondary | ICD-10-CM | POA: Diagnosis not present

## 2023-03-25 DIAGNOSIS — A0101 Typhoid meningitis: Secondary | ICD-10-CM | POA: Diagnosis not present

## 2023-04-03 ENCOUNTER — Other Ambulatory Visit (HOSPITAL_COMMUNITY): Payer: Self-pay

## 2023-04-03 MED ORDER — SERTRALINE HCL 100 MG PO TABS
100.0000 mg | ORAL_TABLET | Freq: Every day | ORAL | 3 refills | Status: AC
  Filled 2023-04-03: qty 90, 90d supply, fill #0
  Filled 2023-06-23: qty 30, 30d supply, fill #1
  Filled 2023-07-27: qty 30, 30d supply, fill #2
  Filled 2023-09-01 – 2023-09-02 (×2): qty 30, 30d supply, fill #3
  Filled 2023-10-06: qty 30, 30d supply, fill #4
  Filled 2023-11-06: qty 30, 30d supply, fill #5

## 2023-04-03 MED ORDER — AZITHROMYCIN 250 MG PO TABS
ORAL_TABLET | ORAL | 0 refills | Status: AC
  Filled 2023-04-03: qty 6, 5d supply, fill #0

## 2023-04-04 ENCOUNTER — Other Ambulatory Visit (HOSPITAL_COMMUNITY): Payer: Self-pay

## 2023-04-04 MED ORDER — FLUTICASONE-SALMETEROL 250-50 MCG/ACT IN AEPB
1.0000 | INHALATION_SPRAY | Freq: Two times a day (BID) | RESPIRATORY_TRACT | 11 refills | Status: AC
  Filled 2023-04-04: qty 60, 30d supply, fill #0

## 2023-04-10 ENCOUNTER — Other Ambulatory Visit (HOSPITAL_COMMUNITY): Payer: Self-pay

## 2023-04-18 DIAGNOSIS — Z23 Encounter for immunization: Secondary | ICD-10-CM | POA: Diagnosis not present

## 2023-04-21 DIAGNOSIS — A0101 Typhoid meningitis: Secondary | ICD-10-CM | POA: Diagnosis not present

## 2023-04-21 DIAGNOSIS — F4323 Adjustment disorder with mixed anxiety and depressed mood: Secondary | ICD-10-CM | POA: Diagnosis not present

## 2023-05-19 ENCOUNTER — Other Ambulatory Visit (HOSPITAL_COMMUNITY): Payer: Self-pay

## 2023-05-29 DIAGNOSIS — R635 Abnormal weight gain: Secondary | ICD-10-CM | POA: Diagnosis not present

## 2023-05-29 DIAGNOSIS — K219 Gastro-esophageal reflux disease without esophagitis: Secondary | ICD-10-CM | POA: Diagnosis not present

## 2023-05-29 DIAGNOSIS — K760 Fatty (change of) liver, not elsewhere classified: Secondary | ICD-10-CM | POA: Diagnosis not present

## 2023-05-29 DIAGNOSIS — Z860100 Personal history of colon polyps, unspecified: Secondary | ICD-10-CM | POA: Diagnosis not present

## 2023-05-29 DIAGNOSIS — Z1211 Encounter for screening for malignant neoplasm of colon: Secondary | ICD-10-CM | POA: Diagnosis not present

## 2023-05-29 DIAGNOSIS — R748 Abnormal levels of other serum enzymes: Secondary | ICD-10-CM | POA: Diagnosis not present

## 2023-06-02 ENCOUNTER — Other Ambulatory Visit: Payer: Self-pay | Admitting: Gastroenterology

## 2023-06-03 ENCOUNTER — Other Ambulatory Visit (HOSPITAL_COMMUNITY): Payer: Self-pay

## 2023-06-03 DIAGNOSIS — J0100 Acute maxillary sinusitis, unspecified: Secondary | ICD-10-CM | POA: Diagnosis not present

## 2023-06-03 DIAGNOSIS — J309 Allergic rhinitis, unspecified: Secondary | ICD-10-CM | POA: Diagnosis not present

## 2023-06-03 MED ORDER — AMOXICILLIN-POT CLAVULANATE 875-125 MG PO TABS
1.0000 | ORAL_TABLET | Freq: Two times a day (BID) | ORAL | 0 refills | Status: AC
Start: 2023-06-03 — End: ?
  Filled 2023-06-03: qty 20, 10d supply, fill #0

## 2023-06-12 DIAGNOSIS — A0101 Typhoid meningitis: Secondary | ICD-10-CM | POA: Diagnosis not present

## 2023-06-12 DIAGNOSIS — F4323 Adjustment disorder with mixed anxiety and depressed mood: Secondary | ICD-10-CM | POA: Diagnosis not present

## 2023-06-24 ENCOUNTER — Other Ambulatory Visit (HOSPITAL_COMMUNITY): Payer: Self-pay

## 2023-07-15 DIAGNOSIS — F4323 Adjustment disorder with mixed anxiety and depressed mood: Secondary | ICD-10-CM | POA: Diagnosis not present

## 2023-07-15 DIAGNOSIS — A0101 Typhoid meningitis: Secondary | ICD-10-CM | POA: Diagnosis not present

## 2023-07-18 ENCOUNTER — Other Ambulatory Visit (HOSPITAL_COMMUNITY): Payer: Self-pay

## 2023-07-18 MED ORDER — GOLYTELY 236 G PO SOLR
4000.0000 mL | ORAL | 0 refills | Status: AC
  Filled 2023-07-18: qty 4000, 1d supply, fill #0

## 2023-07-28 ENCOUNTER — Encounter (HOSPITAL_COMMUNITY): Payer: Self-pay | Admitting: Gastroenterology

## 2023-07-28 ENCOUNTER — Other Ambulatory Visit (HOSPITAL_COMMUNITY): Payer: Self-pay

## 2023-08-01 ENCOUNTER — Ambulatory Visit (HOSPITAL_COMMUNITY): Admission: RE | Admit: 2023-08-01 | Payer: 59 | Source: Home / Self Care | Admitting: Gastroenterology

## 2023-08-01 ENCOUNTER — Encounter (HOSPITAL_COMMUNITY): Admission: RE | Payer: Self-pay | Source: Home / Self Care

## 2023-08-01 SURGERY — COLONOSCOPY WITH PROPOFOL
Anesthesia: Monitor Anesthesia Care

## 2023-08-10 ENCOUNTER — Other Ambulatory Visit (HOSPITAL_COMMUNITY): Payer: Self-pay

## 2023-08-11 ENCOUNTER — Other Ambulatory Visit (HOSPITAL_COMMUNITY): Payer: Self-pay

## 2023-08-11 MED ORDER — BUPROPION HCL ER (XL) 300 MG PO TB24
300.0000 mg | ORAL_TABLET | Freq: Every morning | ORAL | 3 refills | Status: AC
  Filled 2023-08-11: qty 30, 30d supply, fill #0
  Filled 2023-09-23: qty 30, 30d supply, fill #1
  Filled 2023-10-26 – 2023-10-27 (×2): qty 30, 30d supply, fill #2
  Filled 2023-11-28 (×2): qty 30, 30d supply, fill #3

## 2023-08-25 ENCOUNTER — Other Ambulatory Visit (HOSPITAL_COMMUNITY): Payer: Self-pay

## 2023-08-25 MED ORDER — MONTELUKAST SODIUM 10 MG PO TABS
10.0000 mg | ORAL_TABLET | Freq: Every day | ORAL | 3 refills | Status: AC
  Filled 2023-08-25: qty 30, 30d supply, fill #0
  Filled 2023-09-30: qty 30, 30d supply, fill #1
  Filled 2023-10-26 – 2023-10-27 (×2): qty 30, 30d supply, fill #2

## 2023-09-02 ENCOUNTER — Other Ambulatory Visit (HOSPITAL_COMMUNITY): Payer: Self-pay

## 2023-09-12 DIAGNOSIS — R7303 Prediabetes: Secondary | ICD-10-CM | POA: Diagnosis not present

## 2023-09-12 DIAGNOSIS — Z125 Encounter for screening for malignant neoplasm of prostate: Secondary | ICD-10-CM | POA: Diagnosis not present

## 2023-09-12 DIAGNOSIS — Z0000 Encounter for general adult medical examination without abnormal findings: Secondary | ICD-10-CM | POA: Diagnosis not present

## 2023-09-18 DIAGNOSIS — F320 Major depressive disorder, single episode, mild: Secondary | ICD-10-CM | POA: Diagnosis not present

## 2023-09-18 DIAGNOSIS — G4733 Obstructive sleep apnea (adult) (pediatric): Secondary | ICD-10-CM | POA: Diagnosis not present

## 2023-09-18 DIAGNOSIS — K219 Gastro-esophageal reflux disease without esophagitis: Secondary | ICD-10-CM | POA: Diagnosis not present

## 2023-09-18 DIAGNOSIS — R6882 Decreased libido: Secondary | ICD-10-CM | POA: Diagnosis not present

## 2023-09-18 DIAGNOSIS — E7800 Pure hypercholesterolemia, unspecified: Secondary | ICD-10-CM | POA: Diagnosis not present

## 2023-09-18 DIAGNOSIS — J4521 Mild intermittent asthma with (acute) exacerbation: Secondary | ICD-10-CM | POA: Diagnosis not present

## 2023-09-18 DIAGNOSIS — J309 Allergic rhinitis, unspecified: Secondary | ICD-10-CM | POA: Diagnosis not present

## 2023-09-18 DIAGNOSIS — Z0000 Encounter for general adult medical examination without abnormal findings: Secondary | ICD-10-CM | POA: Diagnosis not present

## 2023-09-18 DIAGNOSIS — R7303 Prediabetes: Secondary | ICD-10-CM | POA: Diagnosis not present

## 2023-09-22 DIAGNOSIS — A0101 Typhoid meningitis: Secondary | ICD-10-CM | POA: Diagnosis not present

## 2023-09-22 DIAGNOSIS — F4323 Adjustment disorder with mixed anxiety and depressed mood: Secondary | ICD-10-CM | POA: Diagnosis not present

## 2023-09-26 ENCOUNTER — Other Ambulatory Visit (HOSPITAL_COMMUNITY): Payer: Self-pay

## 2023-09-30 ENCOUNTER — Other Ambulatory Visit (HOSPITAL_COMMUNITY): Payer: Self-pay

## 2023-10-01 DIAGNOSIS — R6882 Decreased libido: Secondary | ICD-10-CM | POA: Diagnosis not present

## 2023-10-07 ENCOUNTER — Other Ambulatory Visit (HOSPITAL_COMMUNITY): Payer: Self-pay

## 2023-10-07 ENCOUNTER — Other Ambulatory Visit: Payer: Self-pay

## 2023-10-19 IMAGING — CT CT CARDIAC CORONARY ARTERY CALCIUM SCORE
3 series · 14 of 20 positions shown, 16 images · non-contrast
Comparison: None.

CLINICAL DATA: 50-year-old Caucasian male with history of
hyperlipidemia.

EXAM:
CT CARDIAC CORONARY ARTERY CALCIUM SCORE
TECHNIQUE: Non-contrast imaging through the heart was performed using
prospective ECG gating. Image post processing was performed on an
independent workstation, allowing for quantitative analysis of the
heart and coronary arteries. Note that this exam targets the heart
and the chest was not imaged in its entirety.

[Series 2: calcium scoring 2.00 qr36 bestdiast 71% hrt calciu · axial · 0.44mm/px · z∈[+1581,+1661]mm · 4 of 68 slices shown]
[im 14/68  vessel]
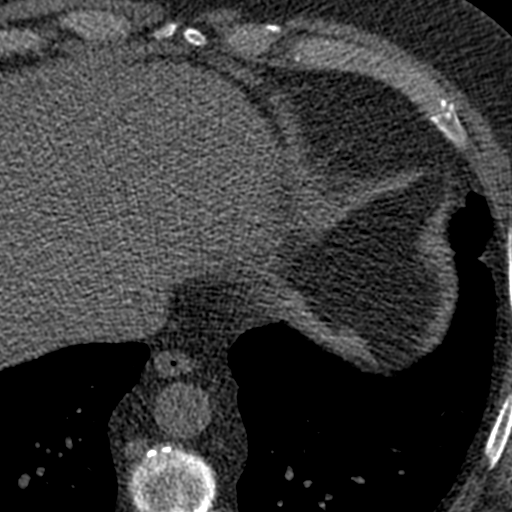
[im 27/68  vessel]
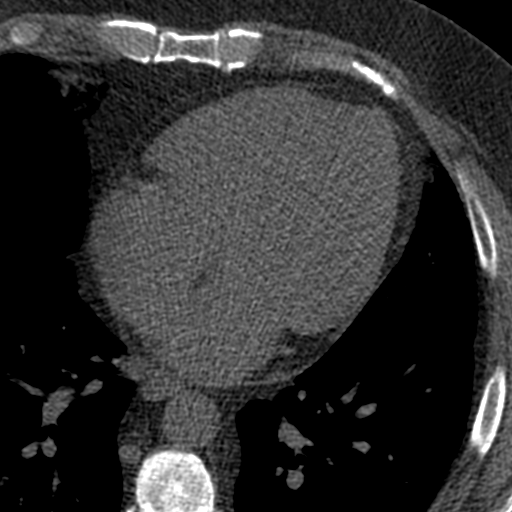
[im 41/68  vessel]
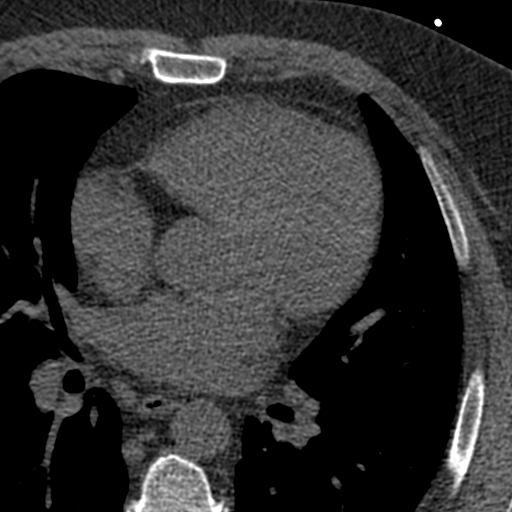
[im 54/68  vessel]
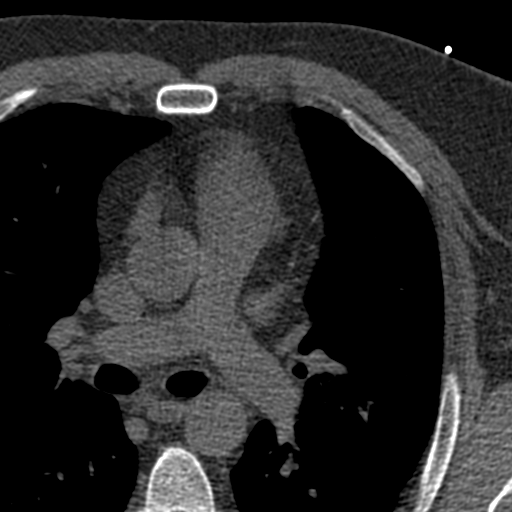

[Series 3: calcium scoring 2.00 br40 bestdiast 71% axial · axial · 0.66mm/px · z∈[+1577,+1665]mm · 5 of 68 slices shown, 7 images]
[im 12/68  vessel]
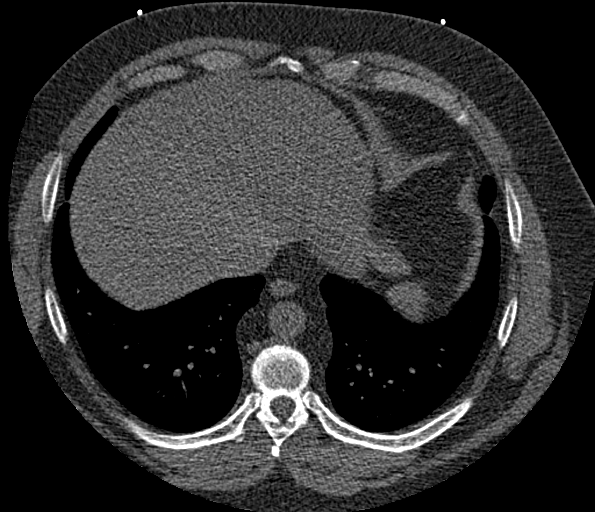
[im 12/68  lung]
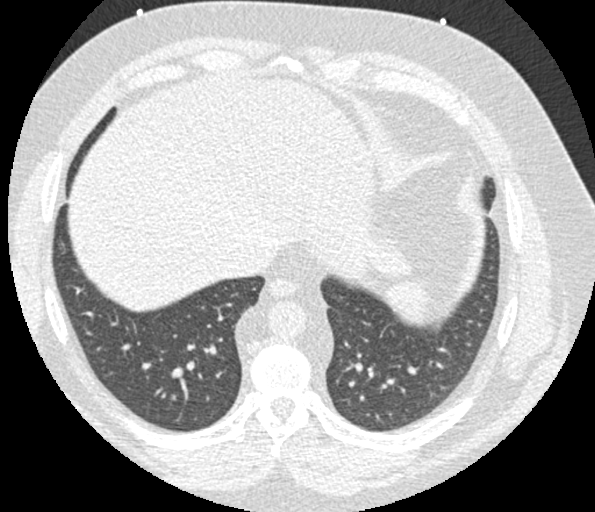
[im 23/68  vessel]
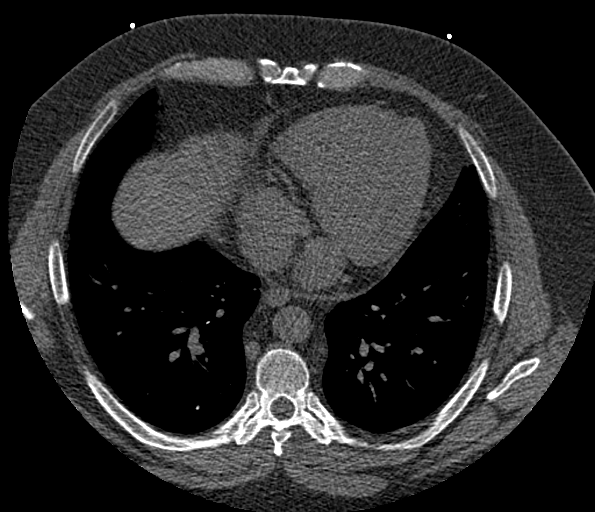
[im 34/68  vessel]
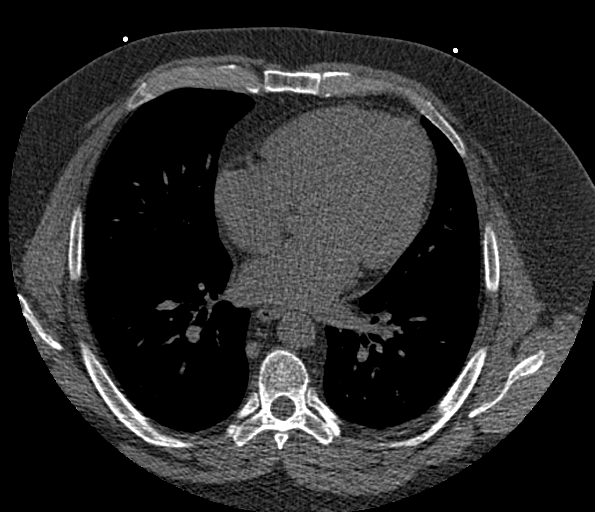
[im 45/68  vessel]
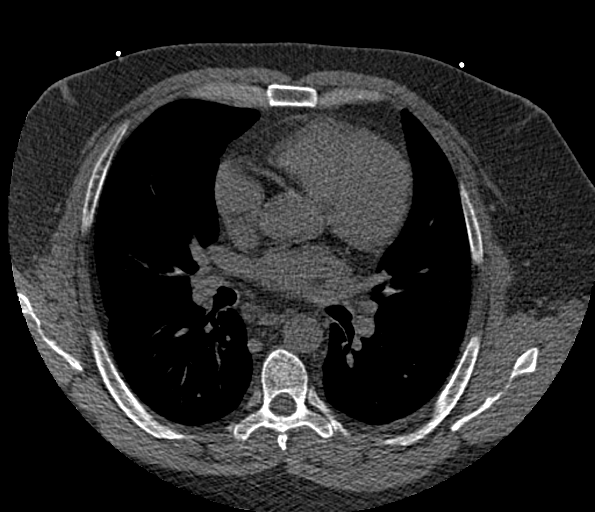
[im 56/68  vessel]
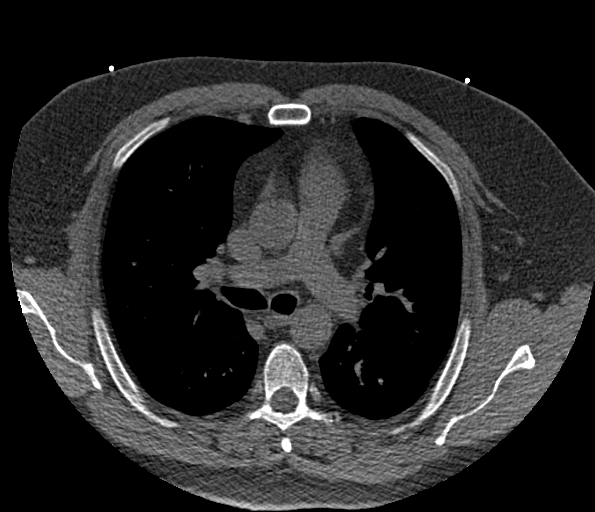
[im 56/68  lung]
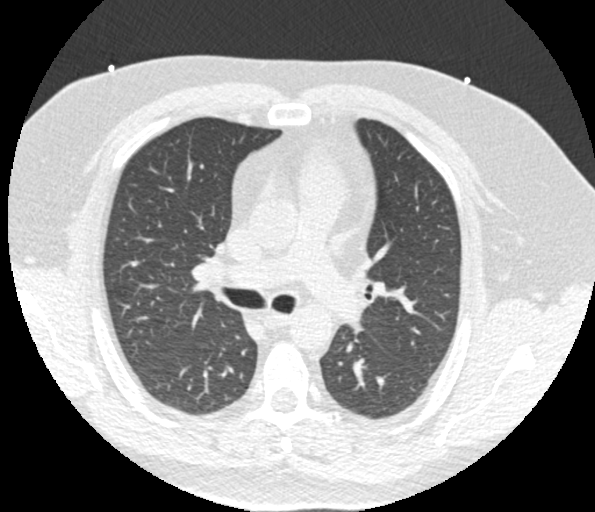

[Series 9: calcium scoring 2.00 br60 bestdiast 71% lungs · axial · 0.66mm/px · z∈[+1575,+1667]mm · 5 of 66 slices shown]
[im 11/66  vessel]
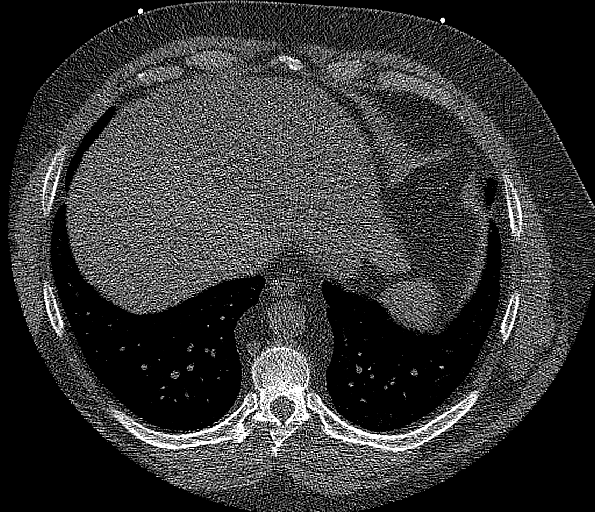
[im 22/66  vessel]
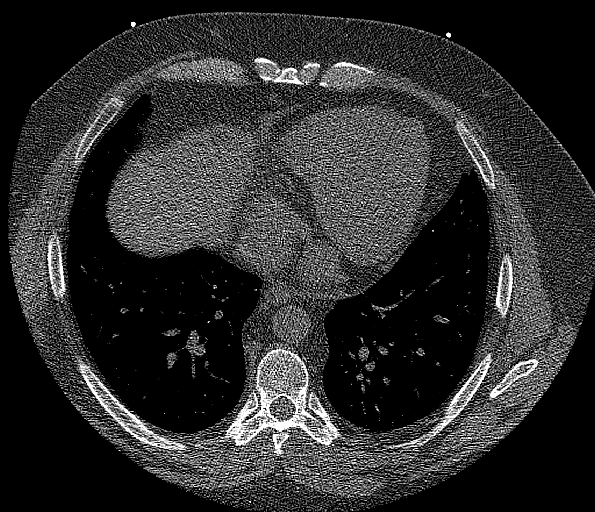
[im 33/66  vessel]
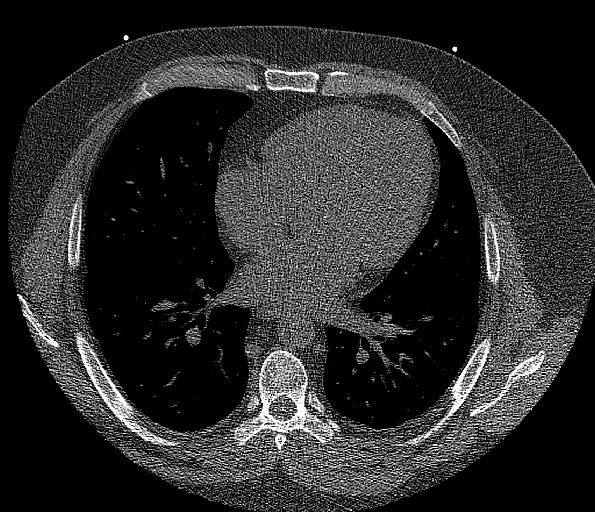
[im 44/66  vessel]
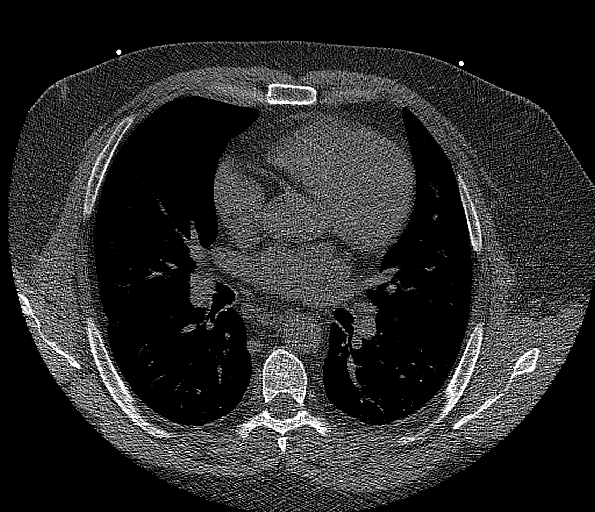
[im 55/66  vessel]
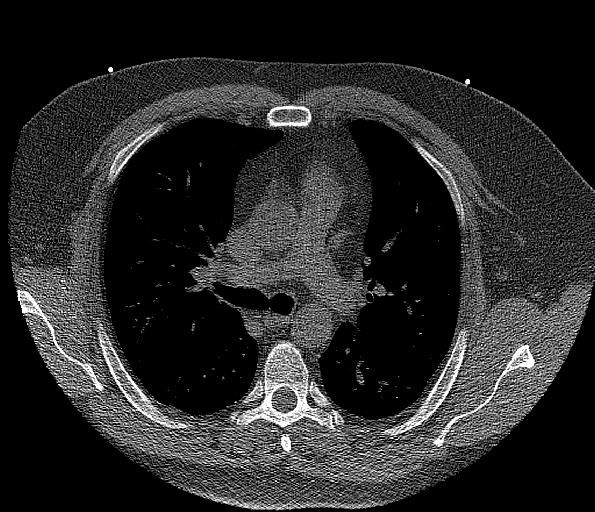

[14 of 20 positions shown; findings below may reference images not displayed]

FINDINGS: CORONARY CALCIUM SCORES:

Left Main: 0

LAD: 0

LCx: 0

RCA: 0

Total Agatston Score: 0

[HOSPITAL] percentile: 0

AORTA MEASUREMENTS:

Ascending Aorta: 30 mm

Descending Aorta: 26 mm

OTHER FINDINGS:


## 2023-10-21 ENCOUNTER — Other Ambulatory Visit (HOSPITAL_COMMUNITY): Payer: Self-pay

## 2023-10-21 DIAGNOSIS — G4733 Obstructive sleep apnea (adult) (pediatric): Secondary | ICD-10-CM | POA: Diagnosis not present

## 2023-10-21 DIAGNOSIS — F320 Major depressive disorder, single episode, mild: Secondary | ICD-10-CM | POA: Diagnosis not present

## 2023-10-21 DIAGNOSIS — F411 Generalized anxiety disorder: Secondary | ICD-10-CM | POA: Diagnosis not present

## 2023-10-21 MED ORDER — DULOXETINE HCL 30 MG PO CPEP
30.0000 mg | ORAL_CAPSULE | Freq: Every day | ORAL | 3 refills | Status: AC
  Filled 2023-10-21: qty 60, 30d supply, fill #0

## 2023-10-21 MED ORDER — ZEPBOUND 2.5 MG/0.5ML ~~LOC~~ SOAJ
2.5000 mg | SUBCUTANEOUS | 0 refills | Status: AC
  Filled 2023-10-21: qty 2, 28d supply, fill #0

## 2023-10-22 ENCOUNTER — Other Ambulatory Visit (HOSPITAL_COMMUNITY): Payer: Self-pay

## 2023-10-27 ENCOUNTER — Other Ambulatory Visit (HOSPITAL_COMMUNITY): Payer: Self-pay

## 2023-10-28 ENCOUNTER — Other Ambulatory Visit (HOSPITAL_COMMUNITY): Payer: Self-pay

## 2023-10-29 ENCOUNTER — Other Ambulatory Visit: Payer: Self-pay | Admitting: Gastroenterology

## 2023-10-30 ENCOUNTER — Other Ambulatory Visit (HOSPITAL_COMMUNITY): Payer: Self-pay

## 2023-10-31 ENCOUNTER — Other Ambulatory Visit (HOSPITAL_COMMUNITY): Payer: Self-pay

## 2023-11-04 ENCOUNTER — Other Ambulatory Visit (HOSPITAL_COMMUNITY): Payer: Self-pay

## 2023-11-05 ENCOUNTER — Other Ambulatory Visit (HOSPITAL_COMMUNITY): Payer: Self-pay

## 2023-11-07 ENCOUNTER — Other Ambulatory Visit: Payer: Self-pay

## 2023-11-07 ENCOUNTER — Other Ambulatory Visit (HOSPITAL_COMMUNITY): Payer: Self-pay

## 2023-11-10 ENCOUNTER — Other Ambulatory Visit (HOSPITAL_COMMUNITY): Payer: Self-pay

## 2023-11-10 MED ORDER — WEGOVY 0.25 MG/0.5ML ~~LOC~~ SOAJ
0.2500 mg | SUBCUTANEOUS | 0 refills | Status: AC
  Filled 2023-11-10 – 2023-11-14 (×2): qty 2, 28d supply, fill #0

## 2023-11-11 ENCOUNTER — Other Ambulatory Visit (HOSPITAL_COMMUNITY): Payer: Self-pay

## 2023-11-11 ENCOUNTER — Other Ambulatory Visit: Payer: Self-pay

## 2023-11-14 ENCOUNTER — Other Ambulatory Visit (HOSPITAL_COMMUNITY): Payer: Self-pay

## 2023-11-26 ENCOUNTER — Encounter (HOSPITAL_COMMUNITY): Payer: Self-pay | Admitting: Gastroenterology

## 2023-11-28 ENCOUNTER — Other Ambulatory Visit (HOSPITAL_COMMUNITY): Payer: Self-pay

## 2023-12-01 ENCOUNTER — Other Ambulatory Visit (HOSPITAL_COMMUNITY): Payer: Self-pay

## 2023-12-16 DIAGNOSIS — J189 Pneumonia, unspecified organism: Secondary | ICD-10-CM | POA: Diagnosis not present

## 2023-12-16 DIAGNOSIS — R053 Chronic cough: Secondary | ICD-10-CM | POA: Diagnosis not present

## 2024-01-16 ENCOUNTER — Encounter (HOSPITAL_COMMUNITY): Payer: Self-pay | Admitting: Gastroenterology

## 2024-01-16 NOTE — Progress Notes (Signed)
 Attempted to obtain medical history for pre op call via telephone, unable to reach at this time. HIPAA compliant voicemail message left requesting return call to pre surgical testing department.

## 2024-01-20 DIAGNOSIS — J189 Pneumonia, unspecified organism: Secondary | ICD-10-CM | POA: Diagnosis not present

## 2024-03-15 DIAGNOSIS — R062 Wheezing: Secondary | ICD-10-CM | POA: Diagnosis not present

## 2024-03-15 DIAGNOSIS — J310 Chronic rhinitis: Secondary | ICD-10-CM | POA: Diagnosis not present

## 2024-04-06 ENCOUNTER — Encounter (HOSPITAL_COMMUNITY): Payer: Self-pay | Admitting: Gastroenterology

## 2024-04-15 NOTE — Anesthesia Preprocedure Evaluation (Addendum)
"                                    Anesthesia Evaluation  Patient identified by MRN, date of birth, ID band Patient awake    Reviewed: Allergy & Precautions, NPO status , Patient's Chart, lab work & pertinent test results  History of Anesthesia Complications Negative for: history of anesthetic complications  Airway Mallampati: II  TM Distance: >3 FB Neck ROM: Full    Dental no notable dental hx. (+) Teeth Intact, Dental Advisory Given   Pulmonary asthma , sleep apnea and Continuous Positive Airway Pressure Ventilation    Pulmonary exam normal breath sounds clear to auscultation       Cardiovascular (-) hypertension(-) angina (-) Past MI Normal cardiovascular exam Rhythm:Regular Rate:Normal     Neuro/Psych  Headaches  Anxiety        GI/Hepatic ,GERD  ,,  Endo/Other    Class 3 obesityZepbound  Renal/GU      Musculoskeletal   Abdominal   Peds  Hematology   Anesthesia Other Findings All: Prednisone   Reproductive/Obstetrics                              Anesthesia Physical Anesthesia Plan  ASA: 3  Anesthesia Plan: MAC   Post-op Pain Management: Minimal or no pain anticipated   Induction:   PONV Risk Score and Plan: 1 and Propofol  infusion and Treatment may vary due to age or medical condition  Airway Management Planned: Natural Airway and Nasal Cannula  Additional Equipment: None  Intra-op Plan:   Post-operative Plan:   Informed Consent: I have reviewed the patients History and Physical, chart, labs and discussed the procedure including the risks, benefits and alternatives for the proposed anesthesia with the patient or authorized representative who has indicated his/her understanding and acceptance.     Dental advisory given  Plan Discussed with: CRNA and Surgeon  Anesthesia Plan Comments: (Screening colonoscopy- )         Anesthesia Quick Evaluation  "

## 2024-04-16 ENCOUNTER — Ambulatory Visit (HOSPITAL_COMMUNITY)
Admission: RE | Admit: 2024-04-16 | Discharge: 2024-04-16 | Disposition: A | Discharge status: Home / Self Care | Attending: Gastroenterology | Admitting: Gastroenterology

## 2024-04-16 ENCOUNTER — Ambulatory Visit (HOSPITAL_COMMUNITY): Admitting: Certified Registered"

## 2024-04-16 ENCOUNTER — Other Ambulatory Visit: Payer: Self-pay

## 2024-04-16 ENCOUNTER — Encounter (HOSPITAL_COMMUNITY)
Admission: RE | Disposition: A | Discharge status: Home / Self Care | Payer: Self-pay | Source: Home / Self Care | Attending: Gastroenterology

## 2024-04-16 ENCOUNTER — Encounter (HOSPITAL_COMMUNITY): Payer: Self-pay | Admitting: Gastroenterology

## 2024-04-16 DIAGNOSIS — F419 Anxiety disorder, unspecified: Secondary | ICD-10-CM

## 2024-04-16 DIAGNOSIS — J45909 Unspecified asthma, uncomplicated: Secondary | ICD-10-CM | POA: Diagnosis not present

## 2024-04-16 DIAGNOSIS — D122 Benign neoplasm of ascending colon: Secondary | ICD-10-CM | POA: Diagnosis not present

## 2024-04-16 DIAGNOSIS — G4730 Sleep apnea, unspecified: Secondary | ICD-10-CM | POA: Diagnosis not present

## 2024-04-16 DIAGNOSIS — Z79899 Other long term (current) drug therapy: Secondary | ICD-10-CM | POA: Insufficient documentation

## 2024-04-16 DIAGNOSIS — G4733 Obstructive sleep apnea (adult) (pediatric): Secondary | ICD-10-CM

## 2024-04-16 DIAGNOSIS — D123 Benign neoplasm of transverse colon: Secondary | ICD-10-CM | POA: Insufficient documentation

## 2024-04-16 DIAGNOSIS — K219 Gastro-esophageal reflux disease without esophagitis: Secondary | ICD-10-CM | POA: Insufficient documentation

## 2024-04-16 DIAGNOSIS — Z860101 Personal history of adenomatous and serrated colon polyps: Secondary | ICD-10-CM | POA: Diagnosis present

## 2024-04-16 DIAGNOSIS — E66813 Obesity, class 3: Secondary | ICD-10-CM | POA: Insufficient documentation

## 2024-04-16 DIAGNOSIS — Z1211 Encounter for screening for malignant neoplasm of colon: Secondary | ICD-10-CM | POA: Insufficient documentation

## 2024-04-16 DIAGNOSIS — Z6841 Body Mass Index (BMI) 40.0 and over, adult: Secondary | ICD-10-CM | POA: Diagnosis not present

## 2024-04-16 HISTORY — PX: POLYPECTOMY: SHX149

## 2024-04-16 HISTORY — PX: COLONOSCOPY: SHX5424

## 2024-04-16 SURGERY — COLONOSCOPY
Anesthesia: Monitor Anesthesia Care

## 2024-04-16 MED ORDER — PROPOFOL 10 MG/ML IV BOLUS
INTRAVENOUS | Status: DC | PRN
  Administered 2024-04-16: 40 mg via INTRAVENOUS

## 2024-04-16 MED ORDER — PROPOFOL 500 MG/50ML IV EMUL
INTRAVENOUS | Status: DC | PRN
  Administered 2024-04-16: 120 ug/kg/min via INTRAVENOUS

## 2024-04-16 MED ORDER — PROPOFOL 1000 MG/100ML IV EMUL
INTRAVENOUS | Status: AC
  Filled 2024-04-16: qty 100

## 2024-04-16 MED ORDER — LIDOCAINE 2% (20 MG/ML) 5 ML SYRINGE
INTRAMUSCULAR | Status: DC | PRN
  Administered 2024-04-16: 100 mg via INTRAVENOUS

## 2024-04-16 MED ORDER — PHENYLEPHRINE 80 MCG/ML (10ML) SYRINGE FOR IV PUSH (FOR BLOOD PRESSURE SUPPORT)
PREFILLED_SYRINGE | INTRAVENOUS | Status: DC | PRN
  Administered 2024-04-16: 80 ug via INTRAVENOUS

## 2024-04-16 MED ORDER — SODIUM CHLORIDE 0.9 % IV SOLN: INTRAVENOUS | Status: DC | PRN

## 2024-04-16 NOTE — H&P (Signed)
 Juan Gallegos HPI: This 54 year old white male presents to the office for colorectal cancer screening. He has 1-2 BM's per day with occasional diarrhea. There is no obvious blood or mucus in the stool. He takes Omeprazole 20 mg BID for acid reflux with good control. He has labs done on 08/19/2022  by his PCP, Dr. Ryan Hives that revealed an elevated phosphatase of 140. He takes 2 Ibuprofen a week for headaches. He denies any alcohol beverages. He has a good appetite. He has gained 38 pounds in 4 years. He denies having any complaints of abdominal pain, nausea, vomiting, dysphagia or odynophagia. He denies having a family history of colon cancer, celiac sprue or IBD. His last colonoscopy was done on 06/04/2018 when tubular adenomas were removed from the sigmoid and transverse colon and hyperplastic polyps were removed from the cecum.  Past Medical History:  Diagnosis Date   Anxiety    GERD (gastroesophageal reflux disease)    Sleep apnea    uses cpap    Past Surgical History:  Procedure Laterality Date   BACK SURGERY     BIOPSY  06/04/2018   Procedure: BIOPSY;  Surgeon: Kristie Lamprey, MD;  Location: [facility];  Service: Endoscopy;;   CHOLECYSTECTOMY N/A 03/06/2020   Procedure: LAPAROSCOPIC CHOLECYSTECTOMY;  Surgeon: Vernetta Berg, MD;  Location: [facility];  Service: General;  Laterality: N/A;   COLONOSCOPY WITH PROPOFOL  N/A 06/04/2018   Procedure: COLONOSCOPY WITH PROPOFOL ;  Surgeon: Kristie Lamprey, MD;  Location: [facility];  Service: Endoscopy;  Laterality: N/A;   NASAL SEPTUM SURGERY     POLYPECTOMY  06/04/2018   Procedure: POLYPECTOMY;  Surgeon: Kristie Lamprey, MD;  Location: [facility];  Service: Endoscopy;;    History reviewed. No pertinent family history.  Social History:  reports that he has never smoked. He has never used smokeless tobacco. He reports that he does not drink alcohol and does not use drugs.  Allergies: Allergies[1]  Medications:  Scheduled: Continuous:  No results found for this or any previous visit (from the past 24 hours).   No results found.  ROS:  As stated above in the HPI otherwise negative.  Blood pressure 131/73, pulse 73, temperature 98 F (36.7 C), temperature source Temporal, resp. rate 16, height 5' 11 (1.803 m), weight (!) 145.2 kg, SpO2 94%.    PE: Gen: NAD, Alert and Oriented HEENT:  [place]/AT, EOMI Neck: Supple, no LAD Lungs: CTA Bilaterally CV: RRR without M/G/R ABD: Soft, NTND, +BS Ext: No C/C/E  Assessment/Plan: Screening colonoscopy.  Sansa Alkema D 04/16/2024, 7:14 AM         [1]  Allergies Allergen Reactions   Prednisone      Hot flashes

## 2024-04-16 NOTE — Op Note (Signed)
 [facility] Patient Name: Juan Gallegos Procedure Date: 04/16/2024 MRN: [id number] Attending MD: Belvie Just , MD, [id number] Date of Birth: [birth year] CSN: [id number] Age: 54 Admit Type: Outpatient Procedure:                Colonoscopy Indications:              Screening for colorectal malignant neoplasm Providers:                Belvie Just, MD, Ozell Pouch, Curtistine Bishop, Technician Referring MD:             Belvie Just, MD Medicines:                Propofol  per Anesthesia Complications:            No immediate complications. Estimated Blood Loss:     Estimated blood loss: none. Procedure:                Pre-Anesthesia Assessment:                           - Prior to the procedure, a History and Physical                            was performed, and patient medications and                            allergies were reviewed. The patient's tolerance of                            previous anesthesia was also reviewed. The risks                            and benefits of the procedure and the sedation                            options and risks were discussed with the patient.                            All questions were answered, and informed consent                            was obtained. Prior Anticoagulants: The patient has                            taken no anticoagulant or antiplatelet agents. ASA                            Grade Assessment: III - A patient with severe                            systemic disease. After reviewing the risks and  benefits, the patient was deemed in satisfactory                            condition to undergo the procedure.                           - Sedation was administered by an anesthesia                            professional. Deep sedation was attained.                           After obtaining informed consent, the colonoscope                            was  passed under direct vision. Throughout the                            procedure, the patient's blood pressure, pulse, and                            oxygen saturations were monitored continuously. The                            CF-HQ190L ([id number] Olympus colonoscope was                            introduced through the anus and advanced to the the                            cecum, identified by appendiceal orifice and                            ileocecal valve. The colonoscopy was somewhat                            difficult due to significant looping. Successful                            completion of the procedure was aided by using                            manual pressure, straightening and shortening the                            scope to obtain bowel loop reduction and using                            scope torsion. The patient tolerated the procedure                            well. The quality of the bowel preparation was  evaluated using the BBPS Procedure [facility] Bowel Preparation                            Scale) with scores of: Right Colon = 2 (minor                            amount of residual staining, small fragments of                            stool and/or opaque liquid, but mucosa seen well),                            Transverse Colon = 3 (entire mucosa seen well with                            no residual staining, small fragments of stool or                            opaque liquid) and Left Colon = 3 (entire mucosa                            seen well with no residual staining, small                            fragments of stool or opaque liquid). The total                            BBPS score equals 8. The quality of the bowel                            preparation was good. Scope In: 7:32:14 AM Scope Out: 7:58:37 AM Scope Withdrawal Time: 0 hours 16 minutes 49 seconds  Total Procedure Duration: 0 hours 26 minutes 23 seconds  Findings:       Three sessile polyps were found in the transverse colon and ascending       colon. The polyps were 3 to 4 mm in size. These polyps were removed with       a cold snare. Resection and retrieval were complete. Impression:               - Three 3 to 4 mm polyps in the transverse colon                            and in the ascending colon, removed with a cold                            snare. Resected and retrieved. Moderate Sedation:      Not Applicable - Patient had care per Anesthesia. Recommendation:           - Patient has a contact number available for                            emergencies. The signs and symptoms of potential  delayed complications were discussed with the                            patient. Return to normal activities tomorrow.                            Written discharge instructions were provided to the                            patient.                           - Resume regular diet.                           - Continue present medications.                           - Await pathology results.                           - Repeat colonoscopy in 5 years for surveillance. Procedure Code(s):        --- Professional ---                           [phone], Colonoscopy, flexible; with removal of                            tumor(s), polyp(s), or other lesion(s) by snare                            technique Diagnosis Code(s):        --- Professional ---                           Z12.11, Encounter for screening for malignant                            neoplasm of colon                           D12.3, Benign neoplasm of transverse colon (hepatic                            flexure or splenic flexure)                           D12.2, Benign neoplasm of ascending colon CPT copyright 2022 [employer]. All rights reserved. The codes documented in this report are preliminary and upon coder review may  be revised to meet current compliance  requirements. Belvie Just, MD Belvie Just, MD 04/16/2024 8:10:06 AM This report has been signed electronically. Number of Addenda: 0

## 2024-04-16 NOTE — Anesthesia Postprocedure Evaluation (Signed)
"   Anesthesia Post Note  Patient: Juan Gallegos  Procedure(s) Performed: COLONOSCOPY POLYPECTOMY, INTESTINE     Patient location during evaluation: Endoscopy Anesthesia Type: MAC Level of consciousness: awake and alert Pain management: pain level controlled Vital Signs Assessment: post-procedure vital signs reviewed and stable Respiratory status: spontaneous breathing, nonlabored ventilation, respiratory function stable and patient connected to nasal cannula oxygen Cardiovascular status: stable and blood pressure returned to baseline Postop Assessment: no apparent nausea or vomiting Anesthetic complications: no   No notable events documented.  Last Vitals:  Vitals:   04/16/24 0815 04/16/24 0820  BP:  (!) 121/56  Pulse: 72 66  Resp: 19 20  Temp:    SpO2: 95% 94%    Last Pain:  Vitals:   04/16/24 0820  TempSrc:   PainSc: 0-No pain                 Garnette LABOR [name]      "

## 2024-04-16 NOTE — Discharge Instructions (Signed)

## 2024-04-16 NOTE — Anesthesia Procedure Notes (Signed)
 Procedure Name: MAC Date/Time: 04/16/2024 7:25 AM  Performed by: Metta Andrea NOVAK, CRNAPre-anesthesia Checklist: Patient identified, Emergency Drugs available, Suction available, Patient being monitored and Timeout performed Oxygen Delivery Method: Simple face mask Placement Confirmation: positive ETCO2

## 2024-04-16 NOTE — Transfer of Care (Signed)
 Immediate Anesthesia Transfer of Care Note  Patient: Juan Gallegos  Procedure(s) Performed: COLONOSCOPY POLYPECTOMY, INTESTINE  Patient Location: PACU and Endoscopy Unit  Anesthesia Type:MAC  Level of Consciousness: awake, alert , and patient cooperative  Airway & Oxygen Therapy: Patient Spontanous Breathing and Patient connected to face mask oxygen  Post-op Assessment: Report given to RN and Post -op Vital signs reviewed and stable  Post vital signs: Reviewed and stable  Last Vitals:  Vitals Value Taken Time  BP 114/64 04/16/24 08:05  Temp    Pulse 76 04/16/24 08:07  Resp 19 04/16/24 08:07  SpO2 98 % 04/16/24 08:07  Vitals shown include unfiled device data.  Last Pain:  Vitals:   04/16/24 0703  TempSrc: Temporal  PainSc: 0-No pain         Complications: No notable events documented.

## 2024-04-18 ENCOUNTER — Encounter (HOSPITAL_COMMUNITY): Payer: Self-pay | Admitting: Gastroenterology

## 2024-04-19 LAB — SURGICAL PATHOLOGY

## 2024-04-27 ENCOUNTER — Other Ambulatory Visit (HOSPITAL_COMMUNITY): Payer: Self-pay

## 2024-05-11 ENCOUNTER — Ambulatory Visit (HOSPITAL_BASED_OUTPATIENT_CLINIC_OR_DEPARTMENT_OTHER)

## 2024-06-01 ENCOUNTER — Ambulatory Visit (HOSPITAL_BASED_OUTPATIENT_CLINIC_OR_DEPARTMENT_OTHER)
